# Patient Record
Sex: Female | Born: 1975 | Race: White | Hispanic: Yes | Marital: Single | Smoking: Never smoker
Health system: Southern US, Community
[De-identification: ages and names within clinical notes are randomized; demographics above are authoritative.]

---

## 2007-11-28 ENCOUNTER — Inpatient Hospital Stay (HOSPITAL_COMMUNITY): Admission: AD | Admit: 2007-11-28 | Discharge: 2007-11-28 | Payer: Self-pay | Admitting: Gynecology

## 2007-12-02 ENCOUNTER — Inpatient Hospital Stay (HOSPITAL_COMMUNITY): Admission: AD | Admit: 2007-12-02 | Discharge: 2007-12-02 | Payer: Self-pay | Admitting: Obstetrics & Gynecology

## 2007-12-12 ENCOUNTER — Ambulatory Visit: Payer: Self-pay | Admitting: Obstetrics & Gynecology

## 2007-12-12 ENCOUNTER — Other Ambulatory Visit: Admission: RE | Admit: 2007-12-12 | Discharge: 2007-12-12 | Payer: Self-pay | Admitting: Obstetrics and Gynecology

## 2007-12-26 ENCOUNTER — Ambulatory Visit: Payer: Self-pay | Admitting: Obstetrics and Gynecology

## 2009-12-24 ENCOUNTER — Ambulatory Visit (HOSPITAL_COMMUNITY): Admission: RE | Admit: 2009-12-24 | Discharge: 2009-12-24 | Payer: Self-pay | Admitting: Family Medicine

## 2010-01-10 ENCOUNTER — Ambulatory Visit (HOSPITAL_COMMUNITY): Admission: RE | Admit: 2010-01-10 | Discharge: 2010-01-10 | Payer: Self-pay | Admitting: Obstetrics & Gynecology

## 2010-02-14 ENCOUNTER — Ambulatory Visit: Payer: Self-pay | Admitting: Family Medicine

## 2010-02-21 ENCOUNTER — Ambulatory Visit: Payer: Self-pay | Admitting: Obstetrics & Gynecology

## 2010-02-21 ENCOUNTER — Encounter
Admission: RE | Admit: 2010-02-21 | Discharge: 2010-04-04 | Payer: Self-pay | Source: Home / Self Care | Attending: Family Medicine | Admitting: Family Medicine

## 2010-02-28 ENCOUNTER — Ambulatory Visit: Payer: Self-pay | Admitting: Obstetrics and Gynecology

## 2010-03-03 ENCOUNTER — Ambulatory Visit: Payer: Self-pay | Admitting: Obstetrics & Gynecology

## 2010-03-07 ENCOUNTER — Ambulatory Visit: Payer: Self-pay | Admitting: Obstetrics & Gynecology

## 2010-03-09 ENCOUNTER — Ambulatory Visit: Payer: Self-pay | Admitting: Obstetrics and Gynecology

## 2010-03-14 ENCOUNTER — Ambulatory Visit: Payer: Self-pay | Admitting: Obstetrics & Gynecology

## 2010-03-21 ENCOUNTER — Ambulatory Visit: Payer: Self-pay | Admitting: Obstetrics & Gynecology

## 2010-03-24 ENCOUNTER — Ambulatory Visit: Payer: Self-pay | Admitting: Obstetrics & Gynecology

## 2010-03-28 ENCOUNTER — Ambulatory Visit: Payer: Self-pay | Admitting: Obstetrics & Gynecology

## 2010-03-28 ENCOUNTER — Encounter: Payer: Self-pay | Admitting: Obstetrics & Gynecology

## 2010-03-28 LAB — CONVERTED CEMR LAB
Chlamydia, DNA Probe: NEGATIVE
GC Probe Amp, Genital: NEGATIVE

## 2010-03-29 ENCOUNTER — Encounter: Payer: Self-pay | Admitting: Obstetrics & Gynecology

## 2010-03-31 ENCOUNTER — Ambulatory Visit: Payer: Self-pay | Admitting: Obstetrics and Gynecology

## 2010-04-04 ENCOUNTER — Ambulatory Visit: Payer: Self-pay | Admitting: Obstetrics and Gynecology

## 2010-04-07 ENCOUNTER — Ambulatory Visit: Payer: Self-pay | Admitting: Obstetrics and Gynecology

## 2010-04-12 ENCOUNTER — Ambulatory Visit: Payer: Self-pay | Admitting: Obstetrics and Gynecology

## 2010-04-13 ENCOUNTER — Observation Stay (HOSPITAL_COMMUNITY)
Admission: RE | Admit: 2010-04-13 | Discharge: 2010-04-13 | Payer: Self-pay | Source: Home / Self Care | Attending: Obstetrics & Gynecology | Admitting: Obstetrics & Gynecology

## 2010-04-14 ENCOUNTER — Ambulatory Visit: Payer: Self-pay | Admitting: Obstetrics & Gynecology

## 2010-04-15 ENCOUNTER — Encounter: Payer: Self-pay | Admitting: Obstetrics & Gynecology

## 2010-04-18 ENCOUNTER — Ambulatory Visit
Admission: RE | Admit: 2010-04-18 | Discharge: 2010-04-18 | Payer: Self-pay | Source: Home / Self Care | Attending: Obstetrics & Gynecology | Admitting: Obstetrics & Gynecology

## 2010-04-19 ENCOUNTER — Inpatient Hospital Stay (HOSPITAL_COMMUNITY)
Admission: RE | Admit: 2010-04-19 | Discharge: 2010-04-23 | Payer: Self-pay | Source: Home / Self Care | Attending: Family Medicine | Admitting: Family Medicine

## 2010-04-20 LAB — GLUCOSE, CAPILLARY
Glucose-Capillary: 94 mg/dL (ref 70–99)
Glucose-Capillary: 91 mg/dL (ref 70–99)
Glucose-Capillary: 92 mg/dL (ref 70–99)
Glucose-Capillary: 90 mg/dL (ref 70–99)
Glucose-Capillary: 144 mg/dL — ABNORMAL HIGH (ref 70–99)
Glucose-Capillary: 161 mg/dL — ABNORMAL HIGH (ref 70–99)

## 2010-04-21 LAB — CBC
HCT: 32.4 % — ABNORMAL LOW (ref 36.0–46.0)
Hemoglobin: 11 g/dL — ABNORMAL LOW (ref 12.0–15.0)
MCH: 29.5 pg (ref 26.0–34.0)
MCHC: 34 g/dL (ref 30.0–36.0)
MCV: 86.9 fL (ref 78.0–100.0)
Platelets: 196 10*3/uL (ref 150–400)
RBC: 3.73 MIL/uL — ABNORMAL LOW (ref 3.87–5.11)
RDW: 13.1 % (ref 11.5–15.5)
WBC: 11.1 10*3/uL — ABNORMAL HIGH (ref 4.0–10.5)

## 2010-04-21 LAB — GLUCOSE, CAPILLARY
Glucose-Capillary: 104 mg/dL — ABNORMAL HIGH (ref 70–99)
Glucose-Capillary: 90 mg/dL (ref 70–99)
Glucose-Capillary: 159 mg/dL — ABNORMAL HIGH (ref 70–99)
Glucose-Capillary: 144 mg/dL — ABNORMAL HIGH (ref 70–99)

## 2010-04-22 LAB — GLUCOSE, CAPILLARY
Glucose-Capillary: 102 mg/dL — ABNORMAL HIGH (ref 70–99)
Glucose-Capillary: 122 mg/dL — ABNORMAL HIGH (ref 70–99)

## 2010-05-02 LAB — GLUCOSE, CAPILLARY
Glucose-Capillary: 115 mg/dL — ABNORMAL HIGH (ref 70–99)
Glucose-Capillary: 179 mg/dL — ABNORMAL HIGH (ref 70–99)
Glucose-Capillary: 110 mg/dL — ABNORMAL HIGH (ref 70–99)

## 2010-05-09 NOTE — Op Note (Addendum)
[name], [name]        ACCOUNT NO.:  [id number]  MEDICAL RECORD NO.:  [id number]         PATIENT TYPE:  WINP  LOCATION:  101                           FACILITY:  [facility]  PHYSICIAN:  Scheryl Darter, MD       DATE OF BIRTH:  01/18/1976  DATE OF PROCEDURE: DATE OF DISCHARGE:                              OPERATIVE REPORT   PROCEDURES:  A repeat low-transverse cesarean section and bilateral tubal ligation.  PREOPERATIVE DIAGNOSES: 1. Intrauterine pregnancy at 35 weeks' gestation with desire for     repeat C-section after the trial of labor. 2. Undesired fertility.  POSTOPERATIVE DIAGNOSES: 1. Intrauterine pregnancy, delivered. 2. Tubal sterilization.  SURGEON:  Scheryl Darter, MD  ASSISTANT:  Caren Griffins, CNM  ANESTHESIA:  Epidural.  ESTIMATED BLOOD LOSS:  900 mL.  FINDINGS:  Live born female infant, delivered at 2:47, 8 pounds, Apgars 9 and 9.  SPECIMEN:  None.  COMPLICATIONS:  None.  DRAINS:  Foley catheter.  COUNTS:  Correct.  OPERATIVE COURSE:  The patient gave written consent for repeat cesarean section and bilateral tubal ligation.  The patient was at 51 weeks' gestation undergoing induction of labor.  She had a previous cesarean section.  While being induced, she elected to have a cesarean section. She requested tubal sterilization.  Procedure and risks were explained. The patient identification was confirmed and she was brought to the OR. Adequate epidural anesthesia was induced.  She was placed in dorsal supine position.  She was in left lateral tilt.  Foley catheter was in place.  Abdomen was sterilely prepped and draped.  A #10 blade was used to make a Pfannenstiel incision down to the fascia.  Hemostasis was obtained with cautery.  The fascia was incised and the incision was extended transversely.  Fascia was separated from underlying tissue attachments with blunt and sharp dissection.  Bellies of the rectus muscles were separated.  Underlying  peritoneum was elevated and incised with Metzenbaum scissors and the incision was extended vertically. Bladder blade was inserted.  The peritoneum and the vesicouterine fold was incised transversely with Metzenbaum scissors and bladder flap was created.  A #10 blade was used to make an incision at the midline of the uterus.  Clear fluid was expressed.  Incision was extended transversely. Fetal head was elevated.  In order to assist in delivering the head, Kiwi was applied and head was delivered.  A loose nuchal cord was induced.  Mouth and nose were cleared with bulb suction.  Infant was delivered.  It was a live born female delivered at 2:47.  Cord was clamped, cut, and the infant was handed to nursery personnel in attendance.  Apgars were 9 and 9.  Weight was 8 pounds.  Placenta was removed and uterine cavity was explored.  Placenta was sent to L and D. The uterine incision was closed with a running locking suture with 0 Vicryl.  A second layer was used to imbricate with 0 Vicryl.  Good hemostasis was seen.  Both adnexa were inspected and were normal.  The right fallopian tube was elevated and a Filshie clip was placed at about the proximal one- third portion with good positioning  seen.  Likewise, the left tube was ligated with a Filshie clip.  The pelvis was irrigated.  The anterior peritoneum was closed with a running suture with 2-0 Vicryl.  Fascia was closed with a running suture of 0 Vicryl. Skin was irrigated.  Skin was closed with a running subcuticular suture of 4-0 Vicryl.  Sterile dressing was applied.  The patient tolerated the procedure well without complications.  She was brought in stable condition to recovery room.     Scheryl Darter, MD     JA/MEDQ  D:  04/20/2010  T:  04/20/2010  Job:  [id number]  Electronically Signed by Scheryl Darter MD on 05/09/2010 11:43:48 AM

## 2010-05-26 ENCOUNTER — Inpatient Hospital Stay (HOSPITAL_COMMUNITY)
Admission: AD | Admit: 2010-05-26 | Discharge: 2010-05-26 | Disposition: A | Discharge status: Home / Self Care | Payer: Self-pay | Source: Ambulatory Visit | Attending: Obstetrics & Gynecology | Admitting: Obstetrics & Gynecology

## 2010-05-26 DIAGNOSIS — O909 Complication of the puerperium, unspecified: Secondary | ICD-10-CM

## 2010-05-26 DIAGNOSIS — L259 Unspecified contact dermatitis, unspecified cause: Secondary | ICD-10-CM | POA: Insufficient documentation

## 2010-06-13 ENCOUNTER — Ambulatory Visit (INDEPENDENT_AMBULATORY_CARE_PROVIDER_SITE_OTHER): Payer: Self-pay | Admitting: Family Medicine

## 2010-06-13 DIAGNOSIS — F329 Major depressive disorder, single episode, unspecified: Secondary | ICD-10-CM

## 2010-06-13 DIAGNOSIS — F3289 Other specified depressive episodes: Secondary | ICD-10-CM

## 2010-06-17 DIAGNOSIS — Z0189 Encounter for other specified special examinations: Secondary | ICD-10-CM

## 2010-06-18 ENCOUNTER — Encounter: Payer: Self-pay | Admitting: Obstetrics & Gynecology

## 2010-06-18 LAB — CONVERTED CEMR LAB
Glucose, 2 hour: 104 mg/dL (ref 70–139)
Glucose, Fasting: 96 mg/dL (ref 70–99)

## 2010-06-27 LAB — GLUCOSE, CAPILLARY
Glucose-Capillary: 105 mg/dL — ABNORMAL HIGH (ref 70–99)
Glucose-Capillary: 118 mg/dL — ABNORMAL HIGH (ref 70–99)
Glucose-Capillary: 119 mg/dL — ABNORMAL HIGH (ref 70–99)
Glucose-Capillary: 139 mg/dL — ABNORMAL HIGH (ref 70–99)
Glucose-Capillary: 77 mg/dL (ref 70–99)
Glucose-Capillary: 83 mg/dL (ref 70–99)
Glucose-Capillary: 86 mg/dL (ref 70–99)
Glucose-Capillary: 91 mg/dL (ref 70–99)
Glucose-Capillary: 93 mg/dL (ref 70–99)
Glucose-Capillary: 93 mg/dL (ref 70–99)
Glucose-Capillary: 94 mg/dL (ref 70–99)
Glucose-Capillary: 94 mg/dL (ref 70–99)
Glucose-Capillary: 97 mg/dL (ref 70–99)
Glucose-Capillary: 98 mg/dL (ref 70–99)

## 2010-06-27 LAB — CBC
HCT: 39.7 % (ref 36.0–46.0)
Hemoglobin: 13.9 g/dL (ref 12.0–15.0)
MCH: 30 pg (ref 26.0–34.0)
MCHC: 35 g/dL (ref 30.0–36.0)
MCV: 85.7 fL (ref 78.0–100.0)
Platelets: 249 10*3/uL (ref 150–400)
RBC: 4.63 MIL/uL (ref 3.87–5.11)
RDW: 13.2 % (ref 11.5–15.5)
WBC: 9.4 10*3/uL (ref 4.0–10.5)

## 2010-06-27 LAB — POCT URINALYSIS DIPSTICK
Bilirubin Urine: NEGATIVE
Bilirubin Urine: NEGATIVE
Glucose, UA: NEGATIVE mg/dL
Glucose, UA: NEGATIVE mg/dL
Hgb urine dipstick: NEGATIVE
Ketones, ur: NEGATIVE mg/dL
Ketones, ur: NEGATIVE mg/dL
Nitrite: NEGATIVE
Nitrite: NEGATIVE
Protein, ur: 30 mg/dL — AB
Protein, ur: NEGATIVE mg/dL
Specific Gravity, Urine: 1.02 (ref 1.005–1.030)
Specific Gravity, Urine: 1.02 (ref 1.005–1.030)
Urobilinogen, UA: 0.2 mg/dL (ref 0.0–1.0)
Urobilinogen, UA: 1 mg/dL (ref 0.0–1.0)
pH: 6 (ref 5.0–8.0)
pH: 7 (ref 5.0–8.0)

## 2010-06-27 LAB — ABO/RH: ABO/RH(D): O POS

## 2010-06-27 LAB — RPR: RPR Ser Ql: NONREACTIVE

## 2010-06-28 LAB — POCT URINALYSIS DIPSTICK
Bilirubin Urine: NEGATIVE
Bilirubin Urine: NEGATIVE
Bilirubin Urine: NEGATIVE
Bilirubin Urine: NEGATIVE
Bilirubin Urine: NEGATIVE
Bilirubin Urine: NEGATIVE
Bilirubin Urine: NEGATIVE
Glucose, UA: NEGATIVE mg/dL
Glucose, UA: NEGATIVE mg/dL
Glucose, UA: NEGATIVE mg/dL
Glucose, UA: NEGATIVE mg/dL
Glucose, UA: NEGATIVE mg/dL
Glucose, UA: NEGATIVE mg/dL
Glucose, UA: NEGATIVE mg/dL
Hgb urine dipstick: NEGATIVE
Hgb urine dipstick: NEGATIVE
Hgb urine dipstick: NEGATIVE
Hgb urine dipstick: NEGATIVE
Hgb urine dipstick: NEGATIVE
Hgb urine dipstick: NEGATIVE
Hgb urine dipstick: NEGATIVE
Ketones, ur: 15 mg/dL — AB
Ketones, ur: NEGATIVE mg/dL
Ketones, ur: NEGATIVE mg/dL
Ketones, ur: NEGATIVE mg/dL
Ketones, ur: NEGATIVE mg/dL
Ketones, ur: NEGATIVE mg/dL
Ketones, ur: NEGATIVE mg/dL
Nitrite: NEGATIVE
Nitrite: NEGATIVE
Nitrite: NEGATIVE
Nitrite: NEGATIVE
Nitrite: NEGATIVE
Nitrite: NEGATIVE
Nitrite: NEGATIVE
Protein, ur: NEGATIVE mg/dL
Protein, ur: NEGATIVE mg/dL
Protein, ur: NEGATIVE mg/dL
Protein, ur: NEGATIVE mg/dL
Protein, ur: NEGATIVE mg/dL
Protein, ur: NEGATIVE mg/dL
Protein, ur: NEGATIVE mg/dL
Specific Gravity, Urine: 1.01 (ref 1.005–1.030)
Specific Gravity, Urine: 1.015 (ref 1.005–1.030)
Specific Gravity, Urine: 1.015 (ref 1.005–1.030)
Specific Gravity, Urine: 1.015 (ref 1.005–1.030)
Specific Gravity, Urine: 1.02 (ref 1.005–1.030)
Specific Gravity, Urine: 1.02 (ref 1.005–1.030)
Specific Gravity, Urine: 1.025 (ref 1.005–1.030)
Urobilinogen, UA: 0.2 mg/dL (ref 0.0–1.0)
Urobilinogen, UA: 0.2 mg/dL (ref 0.0–1.0)
Urobilinogen, UA: 0.2 mg/dL (ref 0.0–1.0)
Urobilinogen, UA: 0.2 mg/dL (ref 0.0–1.0)
Urobilinogen, UA: 0.2 mg/dL (ref 0.0–1.0)
Urobilinogen, UA: 0.2 mg/dL (ref 0.0–1.0)
Urobilinogen, UA: 0.2 mg/dL (ref 0.0–1.0)
pH: 5.5 (ref 5.0–8.0)
pH: 5.5 (ref 5.0–8.0)
pH: 6 (ref 5.0–8.0)
pH: 6 (ref 5.0–8.0)
pH: 6.5 (ref 5.0–8.0)
pH: 6.5 (ref 5.0–8.0)
pH: 7 (ref 5.0–8.0)

## 2010-06-28 LAB — GLUCOSE, CAPILLARY: Glucose-Capillary: 89 mg/dL (ref 70–99)

## 2010-06-29 LAB — POCT URINALYSIS DIPSTICK
Bilirubin Urine: NEGATIVE
Glucose, UA: NEGATIVE mg/dL
Hgb urine dipstick: NEGATIVE
Nitrite: NEGATIVE
Protein, ur: 30 mg/dL — AB
Specific Gravity, Urine: 1.025 (ref 1.005–1.030)
Urobilinogen, UA: 1 mg/dL (ref 0.0–1.0)
pH: 6.5 (ref 5.0–8.0)

## 2010-07-04 ENCOUNTER — Ambulatory Visit: Payer: Self-pay | Admitting: Occupational Therapy

## 2010-07-08 NOTE — Progress Notes (Signed)
NAME:  [name], [name] NO.:  [id number]  MEDICAL RECORD NO.:  [id number]           PATIENT TYPE:  A  LOCATION:  [facility]                   FACILITY:  [facility]  PHYSICIAN:  Maryelizabeth Kaufmann, MD  DATE OF BIRTH:  [birth year]  DATE OF SERVICE:  06/13/2010                                 CLINIC NOTE  CHIEF COMPLAINT:  Postpartum check.  HISTORY OF PRESENT ILLNESS:  This is a 35 year old gravida 2, para 2-0-0- 2, status post repeat C-section with bilateral tubal ligation after failed __________ labor.  She denies any acute complaints today.  She is breast feeding without any complications.  No redness, no swelling, or abnormal discharge.  She denies any problems with constipation.  Her last bowel movement was approximately 2-3 times a day in the past few days.  She is not currently sexually active.  She is status post bilateral tubal ligation for postpartum contraception.  She has had lochia that lasts about 3 weeks, but she states that her period is starting today on June 13, 2010.  She does have a history of postpartum depression and current depression __________ scale 13.  She denies any previous medication for this problem.  She endorses having the support at home.  She states that the baby is colicky and she seems to be mildly frustrated and she states that she desires to __________ the baby."  She denies otherwise any history of suicidal or homicidal ideation.  She denies any fever, chills, nausea, or vomiting.  PHYSICAL EXAMINATION:  VITAL SIGNS:  Blood pressure 112/64, pulse 62, temperature 98.2, weight 149.2. GENERAL:  No acute distress.  Alert and oriented x4. CHEST:  Clear to auscultation bilaterally.  No wheezing, rales, or rhonchi. CARDIOVASCULAR:  Regular rate and rhythm.  No murmurs, rubs, or gallops. BREAST:  Soft, nontender, nondistended.  No abnormal discharge.  No redness, no swelling.  No engorgement.  No evidence of mastitis or signs and  symptoms of infection. ABDOMEN:  Positive bowel sounds.  Soft, nontender, and nondistended. Incision was well healed. GU:  Sterile speculum exam, normal external genitalia.  Cervix appeared to be normal with some scant blood in vault.  Otherwise, vaginal mucosa appeared to be normal, pink, and moist.  Bimanual exam, soft, anteverted uterus.  Adnexa was bilateral, no tenderness to palpation.  No masses.  ASSESSMENT AND PLAN:  This is a 35 year old gravida 2, para 2-0-0-2, status post repeat C-section on April 20, 2010, of a term delivery status post bilateral tubal ligation.  1. Postpartum depression.  Spoke with patient extensively with the     interpreter and decided to start Zoloft 50 mg one tablet p.o.     daily, will have the patient return in 2 weeks for reevaluation of     her depression. 2. The patient does have a history of gestational diabetes.  The     patient was unable to get her 75 g of __________.  The patient was     advised to reschedule within the next couple of weeks, so we can     have those results at that     time, she comes back in a couple of weeks.  She discussed  that     further and otherwise, her Pap smear is up-to-date as of August     2011.          ______________________________ Maryelizabeth Kaufmann, MD    LC/MEDQ  D:  06/13/2010  T:  06/14/2010  Job:  [id number]

## 2010-07-13 ENCOUNTER — Ambulatory Visit: Payer: Self-pay | Admitting: Family Medicine

## 2010-08-30 NOTE — Group Therapy Note (Signed)
[name], [name]        ACCOUNT NO.:  [id number]   MEDICAL RECORD NO.:  [id number]          PATIENT TYPE:  WOC   LOCATION:  [facility]                   FACILITY:  [facility]   PHYSICIAN:  Johnella Moloney, MD        DATE OF BIRTH:  30-Sep-1975   DATE OF SERVICE:  12/12/2007                                  CLINIC NOTE   CHIEF COMPLAINT:  Dysfunctional uterine bleeding, MAU referral.   HISTORY OF PRESENT ILLNESS:  The patient is a 35 year old gravida 1,  para 1 with last menstrual period that started in mid July, who is here  for evaluation of dysfunctional uterine bleeding.  The patient was seen  on November 28, 2007 in MAU for a 22-day history of vaginal bleeding,  which began the spotting and increased since that time.  This had  started after her Pap smear that was done in July.  On evaluation in the  MAU she was noted to have stable vital signs and hemoglobin that was  done at that point was 9.1 with a hematocrit of 28.7 and ultrasound that  was done showed a distended endometrial canal with a hypoechoic sulcus  in the fundus.  Question clot versus submucosal fibroid versus polyp and  bilateral simple ovarian cysts were noted.  The patient was told to  follow up in the GYN clinic for further evaluation.  On encounter today  the patient denies having any further bleeding over the last few days.  She is nervous about this dysfunctional uterine bleeding and desires  further evaluation.   PAST MEDICAL HISTORY:  None.   PAST SURGICAL HISTORY:  Cesarean section.   PAST OBSTETRICAL/GYNECOLOGIC HISTORY:  The patient had menarche at age  32.  She had normal cycles the last 7 days.  She has heavy flow with her  menstrual period and moderate pain with her period.  She denies any  intermenstrual bleeding.  The patient is currently sexually active with  her husband.  She uses condoms for contraception.  She had one Cesarean  section.  Her last Pap smear was November 07, 2007, which was normal.   She  denies any history of abnormal Pap smears or any sexually transmitted  infections.   MEDICATIONS:  None.   ALLERGIES:  None.  The patient is not allergic to latex.   SOCIAL HISTORY:  The patient lives with her son.  She is currently  employed.  She denies any tobacco, smoking or alcohol history.  She  denies any past or current history of sexual abuse.   REVIEW OF SYSTEMS:  This is remarkable for fatigue and vaginal bleeding.   PHYSICAL EXAMINATION:  Temperature 97.2, pulse 68, blood pressure  105/53, respirations 20, weight 155.8 pounds.  GENERAL:  No apparent distress.  ABDOMEN:  Soft, nontender, nondistended.  PELVIC EXAMINATION:  Normal external female genitalia.  Pink, well  rugated vagina.  No bleeding noted.  Normal cervical contour.   ENDOMETRIAL BIOPSY:  That need for an endometrial biopsy was discussed  with the patient and risks of the biopsy were explained to the patient.  Written informed consent was obtained.  During the pelvic examination,  her cervix was swabbed with Betadine and the interior lip of the cervix  was grabbed with the tenaculum.  A 3 mm Pipelle was advanced into the  uterine cavity and went  in to a depth of 6 cm, and there was some  resistance met, which could not get the Pipelle past the 6 cm. At this  point, the suction was created and the Pipelle was slowly rotated to  obtain a small amount of tissue, which was sent to pathology.  The  patient was having a small amount of bleeding from the procedure and  also from the tenaculum site.  After this was removed, this was  ameliorated using pressure.  The patient was told to have pelvic rest  for the next 2-3 days, to use Motrin for cramping and will follow up in  2 weeks for discussion of results.   ASSESSMENT/PLAN:  [age]--year-old gravida 1 with dysfunctional  uterine bleeding now status post endometrial biopsy.  The patient will  follow up in 2 weeks for discussion of results.  If her  bleeding  continues, she might need a hysteroscopy for her diagnosis and removal  of submucosal lesion.  This will be discussed with her if this continues  to be a problem and pending benign results from the endometrial biopsy.  Of note, the patient is Spanish-speaking and a Spanish interpreter was  present during this entire encounter.           ______________________________  Johnella Moloney, MD     UD/MEDQ  D:  12/12/2007  T:  12/12/2007  Job:  [id number]

## 2011-01-13 LAB — CBC
HCT: 28.7 — ABNORMAL LOW
Hemoglobin: 9.1 — ABNORMAL LOW
MCHC: 31.8
MCV: 69.1 — ABNORMAL LOW
Platelets: 435 — ABNORMAL HIGH
RBC: 4.16
RDW: 17.8 — ABNORMAL HIGH
WBC: 7.6

## 2011-01-13 LAB — POCT PREGNANCY, URINE: Preg Test, Ur: NEGATIVE

## 2011-01-13 LAB — GC/CHLAMYDIA PROBE AMP, GENITAL
Chlamydia, DNA Probe: NEGATIVE
GC Probe Amp, Genital: NEGATIVE

## 2014-08-06 ENCOUNTER — Emergency Department (INDEPENDENT_AMBULATORY_CARE_PROVIDER_SITE_OTHER)
Admission: EM | Admit: 2014-08-06 | Discharge: 2014-08-06 | Disposition: A | Discharge status: Home / Self Care | Payer: Self-pay | Source: Home / Self Care

## 2014-08-06 ENCOUNTER — Encounter (HOSPITAL_COMMUNITY): Payer: Self-pay | Admitting: Emergency Medicine

## 2014-08-06 DIAGNOSIS — R059 Cough, unspecified: Secondary | ICD-10-CM

## 2014-08-06 DIAGNOSIS — J309 Allergic rhinitis, unspecified: Secondary | ICD-10-CM

## 2014-08-06 DIAGNOSIS — R05 Cough: Secondary | ICD-10-CM

## 2014-08-06 MED ORDER — GUAIFENESIN-CODEINE 100-10 MG/5ML PO SOLN: 5.0000 mL | Freq: Four times a day (QID) | ORAL | Status: DC | PRN

## 2014-08-06 MED ORDER — IPRATROPIUM BROMIDE 0.06 % NA SOLN: 2.0000 | Freq: Four times a day (QID) | NASAL | Status: DC

## 2014-08-06 MED ORDER — FLUTICASONE PROPIONATE 50 MCG/ACT NA SUSP: 2.0000 | Freq: Every day | NASAL | Status: DC

## 2014-08-06 NOTE — Discharge Instructions (Signed)
Your symptoms are likely due to severe seasonal allergies. Please use nasal saline multiple times per day to clean your nasal passage. Please use the nasal Atrovent to decrease your runny nose. Please use Flonase or Nasacort at night to help decrease the inflammation and drainage. Please use a daily allergy pill such as Claritin 10 mg or Zyrtec 10 mg. Please use the Robitussin-AC for cough at night to help you sleep. If your eyes are really bothering you you can use Zaditor drops  Sus sntomas son Veronica Garinprobablemente debido a las Theatre manageralergias estacionales graves . Por favor, use nasales salinos varias veces al da para limpiar el conducto nasal . Utilice el Atrovent nasal para disminuir la secrecin nasal . Por favor, use Flonase o Nasacort por la noche para ayudar a disminuir la inflamacin y drenaje. Por favor, use una pldora diaria de alergia tales como Claritin 10 mg o 10 mg Zyrtec . Utilice el Robitussin - CA para la tos por la noche para ayudar a dormir . Si sus ojos realmente estn molestando puede Administrator, sportsutilizar gotas Zaditor

## 2014-08-06 NOTE — ED Notes (Signed)
Patient c/o chest congestion, sneezing, rhinorrhea x 1 week. Patients husband is translating for her. Patient reports she has been taking Claritin with no relief. Patient is in NAD.

## 2014-08-06 NOTE — ED Provider Notes (Signed)
CSN: [id number]     Arrival date & time 08/06/14  1149 History   None    Chief Complaint  Patient presents with  . URI   (Consider location/radiation/quality/duration/timing/severity/associated sxs/prior Treatment) HPI  7 days ago devleoped itchy eyes and runny nose and sneezing. 2 days ago developed raspy voice. Coughing keeping her up at night. Symptoms are not getting worse but also not getting better. Symptoms are intermittent. Worse after being outdoors and in the evenings. claritin w/o benefit.  Denies fevers, nausea, vomiting, diarrhea, abdominal pain, headache, dizziness, LOC, chest pain, palpitations, shortness of breath. Husband acted as Engineer, technical salesinterpretor.    History reviewed. No pertinent past medical history. History reviewed. No pertinent past surgical history. Family History  Problem Relation Age of Onset  . Diabetes Mother    History  Substance Use Topics  . Smoking status: Never Smoker   . Smokeless tobacco: Not on file  . Alcohol Use: No   OB History    No data available     Review of Systems Per HPI with all other pertinent systems negative.   Allergies  Review of patient's allergies indicates no known allergies.  Home Medications   Prior to Admission medications   Medication Sig Start Date End Date Taking? Authorizing Provider  fluticasone (FLONASE) 50 MCG/ACT nasal spray Place 2 sprays into both nostrils at bedtime. 08/06/14   Ozella Rocksavid J Merrell, MD  guaiFENesin-codeine 100-10 MG/5ML syrup Take 5-10 mLs by mouth every 6 (six) hours as needed for cough. 08/06/14   Ozella Rocksavid J Merrell, MD  ipratropium (ATROVENT) 0.06 % nasal spray Place 2 sprays into both nostrils 4 (four) times daily. 08/06/14   Ozella Rocksavid J Merrell, MD   BP 137/73 mmHg  Pulse 78  Temp(Src) 97.6 F (36.4 C) (Oral)  Resp 12  SpO2 98% Physical Exam Physical Exam  Constitutional: oriented to person, place, and time. appears well-developed and well-nourished. No distress.  HENT:   Nasal congestion  and boggy nasal turbinates.   Head: Normocephalic and atraumatic.  Eyes: EOMI. PERRL.  Neck: Normal range of motion.  Cardiovascular: RRR, no m/r/g, 2+ distal pulses,  Pulmonary/Chest: Effort normal and breath sounds normal. No respiratory distress.  Abdominal: Soft. Bowel sounds are normal. NonTTP, no distension.  Musculoskeletal: Normal range of motion. Non ttp, no effusion.  Neurological: alert and oriented to person, place, and time.  Skin: Skin is warm. No rash noted. non diaphoretic.  Psychiatric: normal mood and affect. behavior is normal. Judgment and thought content normal.   ED Course  Procedures (including critical care time) Labs Review Labs Reviewed - No data to display  Imaging Review No results found.   MDM   1. Allergic rhinitis, unspecified allergic rhinitis type   2. Cough    Nasal saline, flonase, nasal atrovent, claritin or zyrtec, robitussin AC.     Ozella Rocksavid J Merrell, MD 08/06/14 1332

## 2014-08-28 ENCOUNTER — Ambulatory Visit (HOSPITAL_COMMUNITY): Payer: Self-pay | Attending: Emergency Medicine

## 2014-08-28 ENCOUNTER — Emergency Department (INDEPENDENT_AMBULATORY_CARE_PROVIDER_SITE_OTHER)
Admission: EM | Admit: 2014-08-28 | Discharge: 2014-08-28 | Disposition: A | Discharge status: Home / Self Care | Payer: Self-pay | Source: Home / Self Care | Attending: Family Medicine | Admitting: Family Medicine

## 2014-08-28 ENCOUNTER — Encounter (HOSPITAL_COMMUNITY): Payer: Self-pay | Admitting: Emergency Medicine

## 2014-08-28 DIAGNOSIS — M6580 Other synovitis and tenosynovitis, unspecified site: Secondary | ICD-10-CM

## 2014-08-28 DIAGNOSIS — M25561 Pain in right knee: Secondary | ICD-10-CM | POA: Insufficient documentation

## 2014-08-28 DIAGNOSIS — M76891 Other specified enthesopathies of right lower limb, excluding foot: Secondary | ICD-10-CM

## 2014-08-28 DIAGNOSIS — R609 Edema, unspecified: Secondary | ICD-10-CM | POA: Insufficient documentation

## 2014-08-28 MED ORDER — PREDNISONE 20 MG PO TABS: ORAL_TABLET | ORAL | Status: DC

## 2014-08-28 NOTE — Discharge Instructions (Signed)
Lesiones por distensiones repetidas  (Repetitive Strain Injuries) Las lesiones por distensiones repetidas son el resultado del uso excesivo o mal uso de los tejidos [employer], tendones y nervios. Los tendones son estructuras similares a cordones que [employer] a los [place]. Estas lesiones pueden producirse en casi cualquier parte del cuerpo. Sin embargo, son ms frecuentes en los brazos (pulgares, [place], codos, hombros) y en las piernas (tobillos, rodillas). Los problemas mdicos causados por esfuerzos repetitivos son el sndrome del tnel carpiano, el codo de [place] o de [place], la bursitis y la tendinitis. Si se trata a tiempo y [employer] repetida se reduce o se elimina, la gravedad y la duracin de sus problemas por lo general disminuye. Este problema tambin se llama trastorno por trauma acumulativo.  CAUSAS  En muchos casos el problema se produce por la repeticin de la misma actividad en el trabajo durante semanas o meses sin descanso suficiente, como tipiar por un tiempo prolongado. Tambin puede ocurrir cuando se Therapist, occupational un hobby o un deporte repetidamente, sin descanso suficiente. Tambin puede ocurrir debido a la tensin o estrs constante sobre una parte del cuerpo en una persona que tiene uno o ms factores de riesgo de sufrir lesiones por movimientos repetitivos.  FACTORES DE RIESGO  Factores de riesgo en el lugar de trabajo  Frecuente uso de la computadora, especialmente si su escritorio no se ajusta a su cuerpo.  No descansar con frecuencia.  Trabajar en un ambiente con mucha presin.  Trabajar a un ritmo rpido.  Repetir el mismo movimiento, como tipiar con frecuencia.  Trabajar en una posicin incmoda o mantener la misma posicin durante [place].  Hacer movimientos forzados Museum/gallery exhibitions officer, tirar o empujar.  La vibracin causada por el uso de herramientas elctricas.  Trabajar en temperaturas fras.  El estrs laboral. Factores de riesgo  personales  Mala postura.  Tener las articulaciones flojas.  No hacer ejercicio regularmente.  Tener sobrepeso.  Artritis, diabetes, problemas de tiroides u otras enfermedades de larga duracin (crnicas).  Dficit de vitaminas.  Mantener las uas largas.  Un estilo de vida poco saludable, estresante o inactivo.  No dormir bien. SNTOMAS  Los sntomas suelen comenzar en el trabajo pero se notan ms despus de que la tensin repetida ha terminado. Por ejemplo, usted puede sentir fatiga o dolor en la [employer] se escribe en el [place], y por la noche, usted puede desarrollar entumecimiento y hormigueo en los dedos. Los sntomas ms comunes son:   Dolor que arde, punza o dolor sordo, especialmente en los dedos, las palmas de las manos, las [place], antebrazos u hombros.  [place].  Hinchazn.  Hormigueo, adormecimiento o prdida de la sensibilidad.  Dolor al Solicitor actividades, como girar la perilla de una puerta o levantar el brazo por encima de su cabeza.  Debilidad, pesadez o prdida de la coordinacin en la mano.  Espasmos o rigidez muscular. En algunos casos, los sntomas pueden llegar a ser tan intensos que es difcil realizar las tareas diarias. Los sntomas que no mejoran con el reposo pueden indicar un trastorno ms grave.  DIAGNSTICO  El mdico puede determinar el tipo de lesin basndose en la evaluacin mdica y en la descripcin de sus actividades.  TRATAMIENTO  El tratamiento depende de la gravedad y el tipo de lesin que sufre. El Firefighter reposo para la parte del cuerpo [place], medicamentos y fisioterapia o terapia ocupacional para reducir Chief Technology Officer, la hinchazn y Chief Technology Officer. Analice con su mdico  las actividades repetitivas que realiza. Su mdico puede ayudarle a decidir si necesita modificar sus actividades. La lesin puede tardar meses o aos en curar, sobre todo si la parte afectada del cuerpo no reposa lo suficiente. En algunos  casos, como en el sndrome del tnel carpiano grave, podrn recomendarle [place]una ciruga.  PREVENCIN   Boyd Kerbsonverse con su supervisor para asegurarse de que tiene el equipo adecuado en su lugar de Pacetrabajo.  Mantenga una buena postura en su escritorio o Environmental consultantlugar de trabajo:  Colocando los pies planos sobre el piso.  Las rodillas deben quedar Beazer Homesdirectamente sobre los pies, dobladas en ngulo recto.  La cintura debe estar bien apoyada en la silla o coloque un almohadn en la curva de la cintura.  Los hombros y los brazos deben estar relajados y a los lados.  El cuello relajado y sin inclinar hacia adelante o hacia atrs.  El escritorio y Immunologistel lugar en que trabaja con la computadora deben estar bien ajustados a su cuerpo.  Ajuste la silla de modo que no presione excesivamente la parte posterior de los muslos.  El teclado debe descansar sobre sus muslos. Debe ser capaz de llegar a las teclas con los codos a los lados, doblados en un ngulo recto. Los antebrazos deben estar descansando sobre apoyabrazos, y quedar paralelos al suelo.  Tener un fcil acceso al mouse.  El monitor debe quedar directamente frente a usted, con los ojos alineadas con la parte superior de la pantalla. La pantalla debe estar entre 15 y 25 centmetros de los ojos.  Al escribir, BB&T Corporationmantenga las muecas rectas, en una posicin neutral. Mueva el brazo entero cuando mueva el puntero del mouse o al tipiar teclas difciles de Baristaalcanzar.  Slo utilice la computadora cuando la necesite para Printmakertrabajar. No la use durante los perodos de descanso.  Tome descansos con frecuencia al Heritage managerrealizar cualquier actividad repetida. [place] con otras tareas que requieran el uso de diferentes msculos, o descanse por lo menos una vez cada hora.  Cambie de posicin con regularidad. Si pasa mucho tiempo sentado, levntese, camine y estrese.  No sostener los lpices o los bolgrafos con fuerza al Programmer, applicationsescribir.  Haga ejercicios regularmente.  Mantenga un peso  saludable.  Consuma una dieta con gran cantidad de vegetales, granos enteros y frutas.  Duerma lo suficiente. INSTRUCCIONES PARA EL CUIDADO EN EL HOGAR   Si su mdico le recet medicamentos para ayudar a reducir la hinchazn, tmelos como le indicaron.  Slo tome medicamentos de venta libre o recetados para Primary school teachercalmar el dolor, las molestias o bajar la fiebre segn las indicaciones de su mdico.  [place]uzca, y si es necesario suspenda, las actividades que causan los problemas hasta que no tenga ningn otro sntoma. Si sus sntomas estn relacionados con Kathie Dikeel trabajo, es posible que necesite hablar con su supervisor acerca de cambiar sus [place]actividades.  Cuando aparezcan los sntomas, aplique hielo o una compresa fra en la zona dolorida.  Ponga el hielo en una bolsa plstica.  Colquese una toalla entre la piel y la bolsa de hielo.  Deje el hielo en el lugar durante 15 a 20 minutos.  Si le Patent attorneyaconsejaron usar un cabestrillo para evitar que la [place]ismueca se doble, selo como le indicaron. Es importante que use el cabestrillo durante la noche. selo todo el tiempo que el mdico se lo recomiende. SOLICITE ATENCIN MDICA SI:   Desarrolla nuevos sntomas.  El dolor no mejora con los medicamentos. ASEGRESE DE QUE:   Comprende estas instrucciones.  Controlar su enfermedad.  Solicitar ayuda de inmediato si no mejora o si empeora. Document Released: 07/20/2008 Document Revised: 10/03/2011 [facility] HospitalExitCare Patient Information 2015 [place]anoExitCare, MarylandLLC. This information is not intended to replace advice given to you by your health care provider. Make sure you discuss any questions you have with your health care provider.  Tendinitis (Tendinitis) La tendinitis es la hinchazn e inflamacin de los tendones. Los tendones son tejidos similares a una banda que conectan el msculo al [place] Prospecthueso. La tendinitis suele producirse en:   Los hombros (manguito rotador).  Los talones (tendn de [place]).  Los codos (tendn del  trceps). CAUSAS La tendinitis suele originarse en el uso excesivo del tendn, los msculos y la articulacin involucrados. Cuando el tejido que rodea al tendn (la sinovia) se inflama, se denomina tenosinovitis. En general, la tendinitis se presenta en las personas que hacen trabajos que requieren movimientos repetitivos. SNTOMAS  Dolor.  Sensibilidad.  Inflamacin leve. DIAGNSTICO La tendinitis normalmente se diagnostica con un examen fsico. El mdico tambin podr solicitar radiografas u otros estudios de diagnstico por imgenes. TRATAMIENTO El mdico podr recomendar determinados medicamentos o ejercicios para Scientist, research (medical)el tratamiento. INSTRUCCIONES PARA EL CUIDADO EN EL HOGAR   Use un cabestrillo o una frula durante todo el tiempo indicado por el mdico hasta que Secretary/administratordisminuya el dolor.  Aplique hielo sobre la zona lesionada.  Ponga el hielo en una bolsa plstica.  Colquese una toalla entre la piel y la bolsa de hielo.  Aplique el hielo durante 15 a 20minutos, 3 a 4veces por da, o como se lo haya indicado el mdico.  Evite utilizar la extremidad mientras sienta dolor en el tendn. Haga ejercicios suaves con amplitud de movimientos solamente como se los haya indicado el mdico. Suspenda los ejercicios si el dolor o las molestias [place]asota Springsaumentan, a menos que el mdico le indique otra cosa.  Utilice los medicamentos de venta libre o recetados para Primary school teachercalmar el dolor, Environmental health practitionerel malestar o la fiebre, segn se lo indique el mdico. SOLICITE ATENCIN MDICA SI:   El dolor y la hinchazn aumentan.  Presenta sntomas nuevos o desconocidos, especialmente mayor adormecimiento en las manos. ASEGRESE DE QUE:   Comprende estas instrucciones.  Controlar su afeccin.  Recibir ayuda de inmediato si no mejora o si empeora. Document Released: 01/11/2005 Document Revised: 08/18/2013 [facility] HospitalExitCare Patient Information 2015 MidwestExitCare, MarylandLLC. This information is not intended to replace advice given to you by your  health care provider. Make sure you discuss any questions you have with your health care provider.

## 2014-08-28 NOTE — ED Notes (Signed)
Patient transported to X-ray 

## 2014-08-28 NOTE — ED Notes (Signed)
C/o right knee pain and swelling onset 5/11 Denies inj/trauma Had similar problem a year ago; treated w/coritisone inj that helped Alert, no signs of acute distress.

## 2014-08-28 NOTE — ED Provider Notes (Signed)
CSN: [id number]     Arrival date & time 08/28/14  1350 History   First MD Initiated Contact with Patient 08/28/14 1439     Chief Complaint  Patient presents with  . Knee Pain   (Consider location/radiation/quality/duration/timing/severity/associated sxs/prior Treatment) HPI Comments: 39 year old Spanish female does not speak [place]English. Arlyn DunningMaggie Mena was interpreter. Patient is complaining of right knee pain that developed approximately 2 days ago. She just woke up with it. She denies any known injury, trauma or fall. She states is been getting worse over the past couple days. Her job requires standing for at least 8 hours every day. She is moderately obese. She states she had this problem approximate 1 year ago and received an injection that helped. The pain is located posterior aspect medial aspect and right anterolateral aspect of the knee. It is painful with attempts to extend as well as flex the knee. She has no PCP.   History reviewed. No pertinent past medical history. History reviewed. No pertinent past surgical history. Family History  Problem Relation Age of Onset  . Diabetes Mother    History  Substance Use Topics  . Smoking status: Never Smoker   . Smokeless tobacco: Not on file  . Alcohol Use: No   OB History    No data available     Review of Systems  Constitutional: Positive for activity change. Negative for fever and fatigue.  Respiratory: Negative for shortness of breath.   Cardiovascular: Negative for chest pain.  Genitourinary: Negative.   Musculoskeletal: Positive for joint swelling. Negative for back pain and neck pain.       As per history of present illness  Skin: Negative.   Neurological: Negative.   Psychiatric/Behavioral: Negative.     Allergies  Review of patient's allergies indicates no known allergies.  Home Medications   Prior to Admission medications   Medication Sig Start Date End Date Taking? Authorizing Provider  fluticasone (FLONASE) 50  MCG/ACT nasal spray Place 2 sprays into both nostrils at bedtime. 08/06/14   Ozella Rocksavid J Merrell, MD  guaiFENesin-codeine 100-10 MG/5ML syrup Take 5-10 mLs by mouth every 6 (six) hours as needed for cough. 08/06/14   Ozella Rocksavid J Merrell, MD  ipratropium (ATROVENT) 0.06 % nasal spray Place 2 sprays into both nostrils 4 (four) times daily. 08/06/14   Ozella Rocksavid J Merrell, MD   BP 135/78 mmHg  Pulse 66  Temp(Src) 98.2 F (36.8 C) (Oral)  Resp 16  SpO2 100% Physical Exam  Constitutional: She appears well-developed and well-nourished. No distress.  Neck: Normal range of motion. Neck supple.  Pulmonary/Chest: Effort normal. No respiratory distress.  Musculoskeletal: She exhibits edema and tenderness.  Right knee with generalized swelling. No erythema or increased warmth. There is tenderness to the medial and lateral joint spaces. Tenderness along the lateral joint line as well as over the anterior proximal tibia and posterior aspect of the knee. Extension to 170 with pain. Flexion to 100 with much pain throughout the knee.  Neurological: She is alert. She exhibits normal muscle tone.  Skin: Skin is warm and dry.  Psychiatric: She has a normal mood and affect.  Nursing note and vitals reviewed.   ED Course  Procedures (including critical care time) Labs Review Labs Reviewed - No data to display  Imaging Review Dg Knee Complete 4 Views Right  08/28/2014   CLINICAL DATA:  Right knee pain with edema for 1 day. No known injury.  EXAM: RIGHT KNEE - COMPLETE 4+ VIEW  COMPARISON:  None.  FINDINGS: No fracture. No bone lesion. Knee joint is normally spaced and aligned. No significant arthropathic change. No joint effusion is evident  Prominent superficial veins are noted in the proximal calf soft tissues. Mild anterior subcutaneous soft tissue edema.  IMPRESSION: No fracture or significant joint abnormality.  No joint effusion.   Electronically Signed   By: Amie Portlandavid  Ormond M.D.   On: 08/28/2014 17:01     MDM    1. Tendinitis of right knee    Ice Knee sleeve for next several days while standing/walking Prednisone as dir F/U with ortho       Hayden Rasmussenavid Avon Mergenthaler, NP 08/28/14 1729

## 2014-09-25 ENCOUNTER — Emergency Department (INDEPENDENT_AMBULATORY_CARE_PROVIDER_SITE_OTHER)
Admission: EM | Admit: 2014-09-25 | Discharge: 2014-09-25 | Disposition: A | Discharge status: Home / Self Care | Payer: Self-pay | Source: Home / Self Care | Attending: Emergency Medicine | Admitting: Emergency Medicine

## 2014-09-25 ENCOUNTER — Encounter (HOSPITAL_COMMUNITY): Payer: Self-pay | Admitting: Emergency Medicine

## 2014-09-25 DIAGNOSIS — M25561 Pain in right knee: Secondary | ICD-10-CM

## 2014-09-25 MED ORDER — HYDROCODONE-ACETAMINOPHEN 5-325 MG PO TABS: 1.0000 | ORAL_TABLET | Freq: Four times a day (QID) | ORAL | Status: DC | PRN

## 2014-09-25 MED ORDER — METHYLPREDNISOLONE ACETATE 40 MG/ML IJ SUSP
INTRAMUSCULAR | Status: AC
  Filled 2014-09-25: qty 1

## 2014-09-25 MED ORDER — NAPROXEN 500 MG PO TABS: 500.0000 mg | ORAL_TABLET | Freq: Two times a day (BID) | ORAL | Status: DC

## 2014-09-25 MED ORDER — HYDROCODONE-ACETAMINOPHEN 5-325 MG PO TABS
ORAL_TABLET | ORAL | Status: AC
  Filled 2014-09-25: qty 1

## 2014-09-25 MED ORDER — HYDROCODONE-ACETAMINOPHEN 5-325 MG PO TABS
1.0000 | ORAL_TABLET | Freq: Once | ORAL | Status: AC
  Administered 2014-09-25: 1 via ORAL

## 2014-09-25 NOTE — ED Provider Notes (Signed)
CSN: [id number]     Arrival date & time 09/25/14  1701 History   None    Chief Complaint  Patient presents with  . Knee Pain   (Consider location/radiation/quality/duration/timing/severity/associated sxs/prior Treatment) HPI  She is a 39 year old woman here for evaluation of right knee pain.  Her husband is present and ask as interpreter. She states her right knee became painful and swollen this afternoon after working. She works at [employer] in [employer], standing on her feet all day. She has had similar pain in the past, but not this bad. She denies any injury or trauma to the knee. Pain is worse with flexion or extension of the knee. Range of motion is limited due to pain. She was last treated for this 1 month ago with a prednisone course, which did help.  History reviewed. No pertinent past medical history. History reviewed. No pertinent past surgical history. Family History  Problem Relation Age of Onset  . Diabetes Mother    History  Substance Use Topics  . Smoking status: Never Smoker   . Smokeless tobacco: Not on file  . Alcohol Use: No   OB History    No data available     Review of Systems As in history of present illness Allergies  Review of patient's allergies indicates no known allergies.  Home Medications   Prior to Admission medications   Medication Sig Start Date End Date Taking? Authorizing Provider  fluticasone (FLONASE) 50 MCG/ACT nasal spray Place 2 sprays into both nostrils at bedtime. 08/06/14   Ozella Rocks, MD  guaiFENesin-codeine 100-10 MG/5ML syrup Take 5-10 mLs by mouth every 6 (six) hours as needed for cough. 08/06/14   Ozella Rocks, MD  HYDROcodone-acetaminophen (NORCO) 5-325 MG per tablet Take 1 tablet by mouth every 6 (six) hours as needed for moderate pain. 09/25/14   Charm Rings, MD  ipratropium (ATROVENT) 0.06 % nasal spray Place 2 sprays into both nostrils 4 (four) times daily. 08/06/14   Ozella Rocks, MD  naproxen (NAPROSYN) 500 MG  tablet Take 1 tablet (500 mg total) by mouth 2 (two) times daily. For 2 weeks, then as needed 09/25/14   Charm Rings, MD   BP 107/67 mmHg  Pulse 68  Temp(Src) 100.2 F (37.9 C) (Oral)  Resp 16  SpO2 99% Physical Exam  Constitutional: She is oriented to person, place, and time. She appears well-developed and well-nourished. She appears distressed ( uncomfortable with exam).  Cardiovascular: Normal rate.   Pulmonary/Chest: Effort normal.  Musculoskeletal:  Right knee: No erythema. She has some soft tissue swelling as well as a joint effusion. She is diffusely tender around the patella and medial and lateral joint lines. Range of motion is limited due to pain. Unable to fully extend the leg. Also not able to flex the knee much passed 120.  Neurological: She is alert and oriented to person, place, and time.    ED Course  Injection of joint Date/Time: 09/25/2014 6:28 PM Performed by: Charm Rings Authorized by: Charm Rings Consent: Verbal consent obtained. Risks and benefits: risks, benefits and alternatives were discussed Consent given by: patient Patient understanding: patient states understanding of the procedure being performed Patient identity confirmed: verbally with patient Time out: Immediately prior to procedure a "time out" was called to verify the correct patient, procedure, equipment, support staff and site/side marked as required. Comments: Skin was prepped with alcohol. Cold spray and 2% lidocaine was used for anesthesia. I attempted to drain  fluid from a superior lateral approach without success. I was able to access the joint space from a lateral approach. I removed approximately 5 mL of clear straw-colored fluid. The syringe was changed using hemostats. I injected 4 mL of 2% lidocaine with 40 mg of Depo-Medrol. Patient tolerated procedure well without immediate complication.   (including critical care time) Labs Review Labs Reviewed  SYNOVIAL CELL COUNT + DIFF, W/  CRYSTALS    Imaging Review No results found.   MDM   1. Right knee pain    Norco 5-325 milligrams by mouth given.  Steroid injection done today after draining 5 mL of fluid. Prescription for Naprosyn as well as Norco given. Recommended frequent icing for the next 2 days. Work note provided. Follow-up in 2 weeks if no improvement.    Charm Rings, MD 09/25/14 [phone]

## 2014-09-25 NOTE — ED Notes (Signed)
Patient c/o knee pain onset today. Patient reports she was seen and treated for this x 1 month ago but does not have any refills. Patients husband is translating for her. He states she cannot bear any weight. Patient is using a wheelchair.

## 2014-09-25 NOTE — Discharge Instructions (Signed)
You may have some early arthritis in the knee. We drained some fluid today and sent it for analysis. We did a steroid injection. This takes 2 days to reach full effect. Please apply ice to the knee frequently over the next 2 days. Take Naprosyn 1 pill twice a day for the next 2 weeks. After that you can use it as needed. Use the Norco every 4-6 hours as needed for severe pain. Follow-up if no improvement in 2 weeks.

## 2015-05-18 IMAGING — DX DG KNEE COMPLETE 4+V*R*
4 series · 4 of 4 positions shown · non-contrast
Comparison: None.

CLINICAL DATA: Right knee pain with edema for 1 day. No known
injury.

EXAM:
RIGHT KNEE - COMPLETE 4+ VIEW

[knee ap]
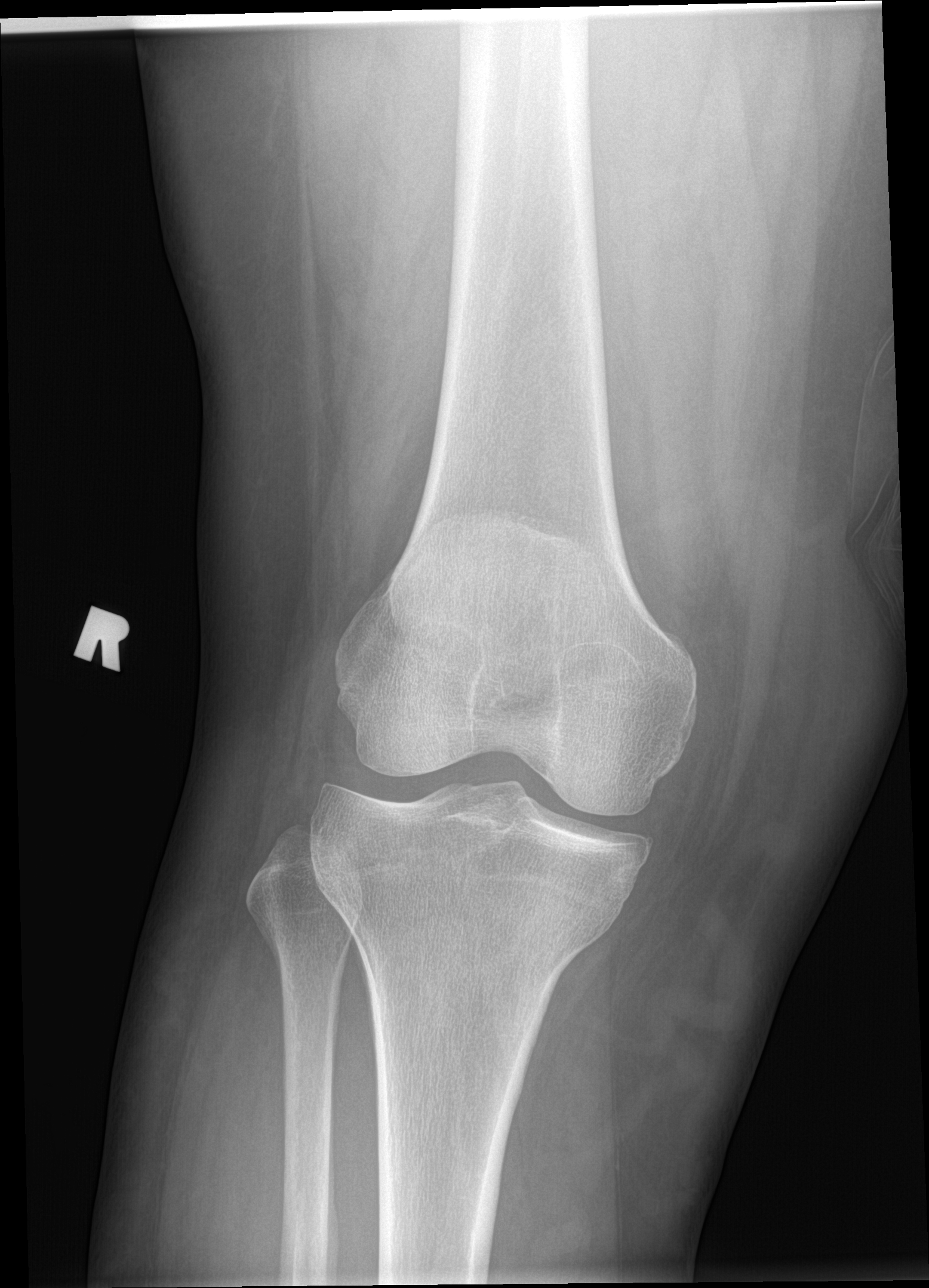

[knee obl]
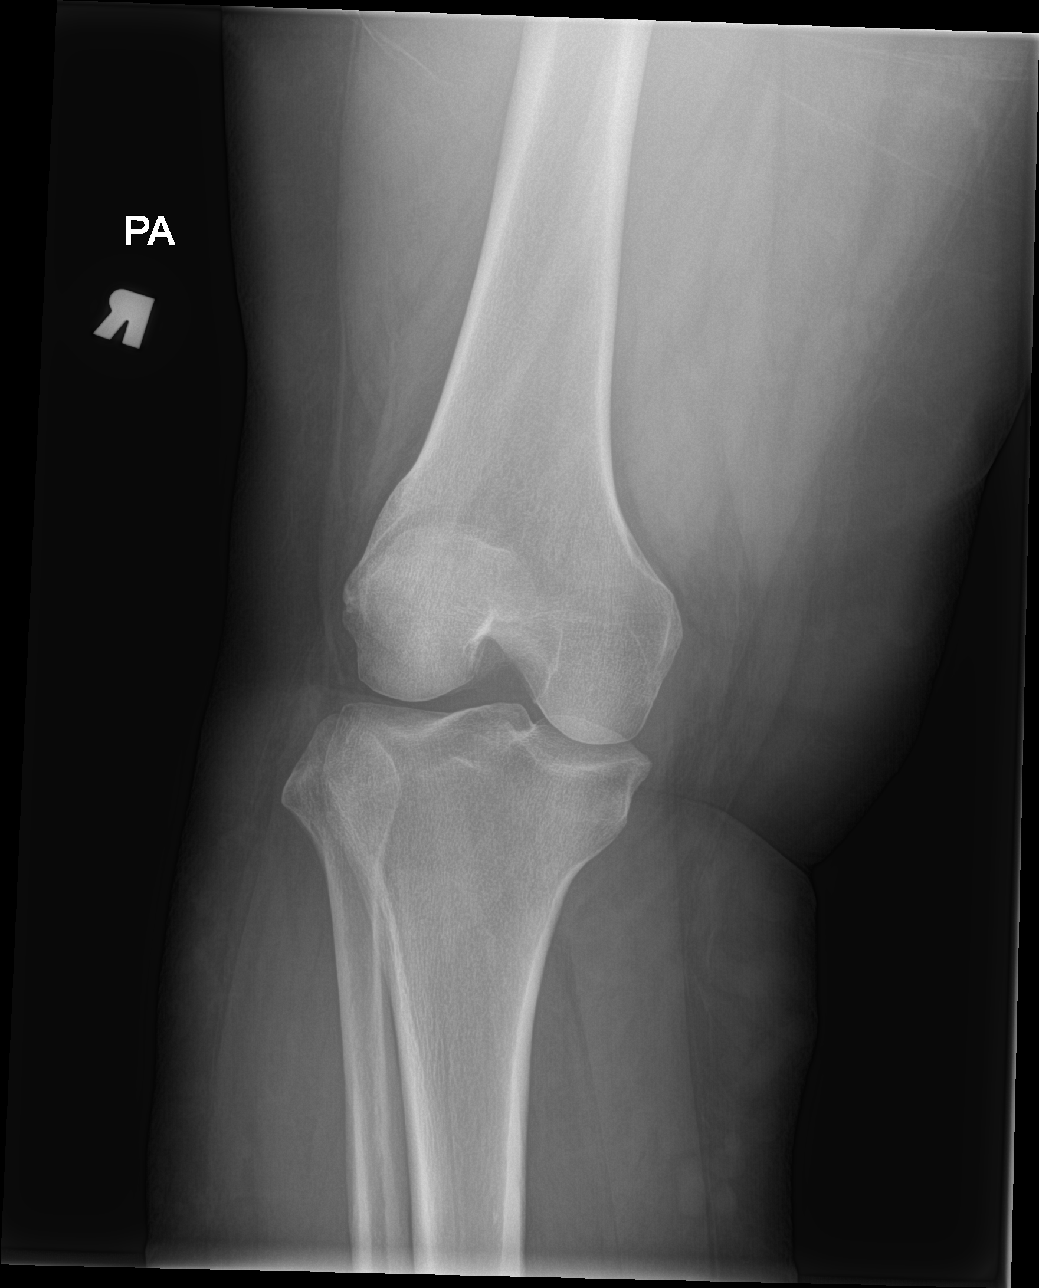

[knee lat]
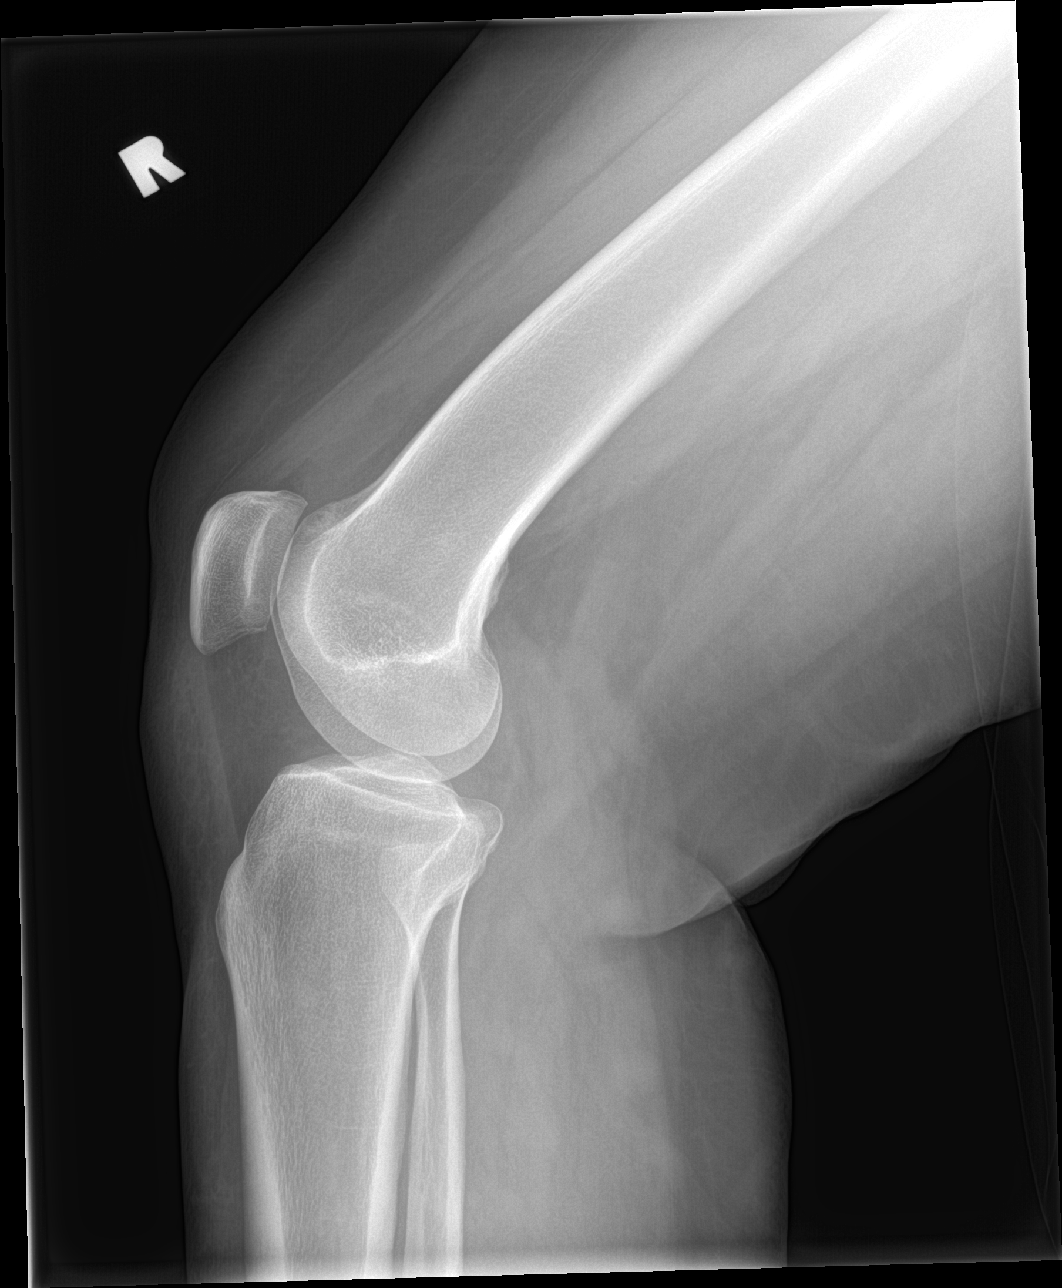

[knee sunrise]
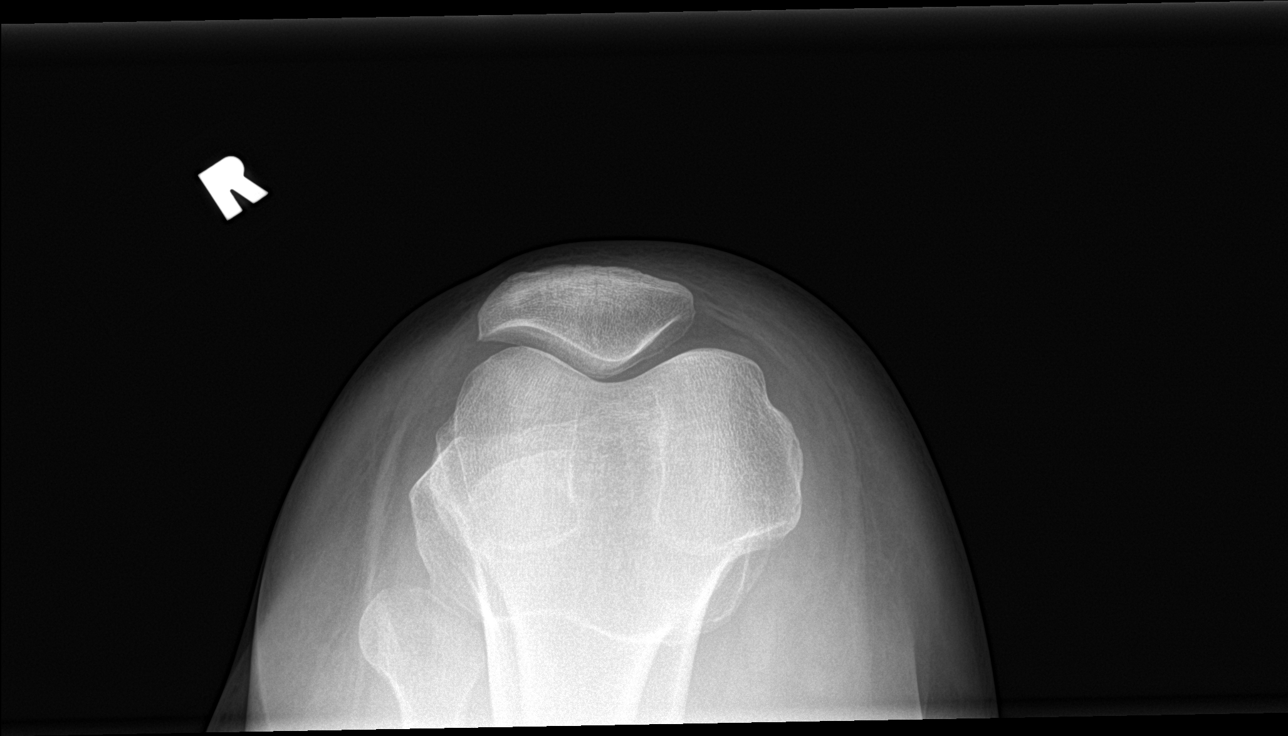

[4 of 4 positions shown; findings below may reference images not displayed]

FINDINGS: No fracture. No bone lesion. Knee joint is normally spaced and
aligned. No significant arthropathic change. No joint effusion is
evident

Prominent superficial veins are noted in the proximal calf soft
tissues. Mild anterior subcutaneous soft tissue edema.
IMPRESSION: No fracture or significant joint abnormality.  No joint effusion.

## 2019-08-25 ENCOUNTER — Ambulatory Visit
Admission: EM | Admit: 2019-08-25 | Discharge: 2019-08-25 | Disposition: A | Discharge status: Home / Self Care | Payer: Self-pay | Attending: Physician Assistant | Admitting: Physician Assistant

## 2019-08-25 DIAGNOSIS — J301 Allergic rhinitis due to pollen: Secondary | ICD-10-CM

## 2019-08-25 DIAGNOSIS — H1013 Acute atopic conjunctivitis, bilateral: Secondary | ICD-10-CM

## 2019-08-25 MED ORDER — FLUTICASONE PROPIONATE 50 MCG/ACT NA SUSP: 2.0000 | Freq: Every day | NASAL | 0 refills | Status: DC

## 2019-08-25 MED ORDER — CETIRIZINE HCL 10 MG PO TABS: 10.0000 mg | ORAL_TABLET | Freq: Every day | ORAL | 0 refills | Status: DC

## 2019-08-25 MED ORDER — PATADAY 0.7 % OP SOLN: 1.0000 [drp] | Freq: Every day | OPHTHALMIC | 0 refills | Status: DC

## 2019-08-25 NOTE — ED Provider Notes (Signed)
[facility]    CSN: [id number] Arrival date & time: 08/25/19  1045      History   Chief Complaint Chief Complaint  Patient presents with  . Allergies    HPI [name] is a 44 y.o. female.   44 year old female comes in for 1 month history of URI symptoms. Has had rhinorrhea, nasal congestion, watering/itching eyes, sneezing. Denies cough. Denies fever, chills, body aches. Denies abdominal pain, nausea, vomiting, diarrhea. Denies shortness of breath, loss of taste/smell. Has been trying otc antihistamine without relief. Vaccinated.     History reviewed. No pertinent past medical history.  There are no problems to display for this patient.   History reviewed. No pertinent surgical history.  OB History   No obstetric history on file.      Home Medications    Prior to Admission medications   Medication Sig Start Date End Date Taking? Authorizing Provider  cetirizine (ZYRTEC ALLERGY) 10 MG tablet Take 1 tablet (10 mg total) by mouth daily. 08/25/19   Cathie Hoops, Milania Haubner V, PA-C  fluticasone (FLONASE) 50 MCG/ACT nasal spray Place 2 sprays into both nostrils daily. 08/25/19   Cathie Hoops, Lorielle Boehning V, PA-C  Olopatadine HCl (PATADAY) 0.7 % SOLN Apply 1 drop to eye daily. 08/25/19   Belinda Fisher, PA-C    Family History Family History  Problem Relation Age of Onset  . Diabetes Mother     Social History Social History   Tobacco Use  . Smoking status: Never Smoker  . Smokeless tobacco: Never Used  Substance Use Topics  . Alcohol use: No  . Drug use: No     Allergies   Patient has no known allergies.   Review of Systems Review of Systems  Reason unable to perform ROS: See HPI as above.     Physical Exam Triage Vital Signs ED Triage Vitals  Enc Vitals Group     BP 08/25/19 1055 123/76     Pulse Rate 08/25/19 1055 (!) 57     Resp 08/25/19 1055 16     Temp 08/25/19 1055 98.1 F (36.7 C)     Temp Source 08/25/19 1055 Oral     SpO2 08/25/19 1055 98 %   Weight --      Height --      Head Circumference --      Peak Flow --      Pain Score 08/25/19 1056 0     Pain Loc --      Pain Edu? --      Excl. in GC? --    No data found.  Updated Vital Signs BP 123/76 (BP Location: Left Arm)   Pulse (!) 57   Temp 98.1 F (36.7 C) (Oral)   Resp 16   SpO2 98%   Physical Exam Constitutional:      General: She is not in acute distress.    Appearance: Normal appearance. She is well-developed. She is not ill-appearing, toxic-appearing or diaphoretic.  HENT:     Head: Normocephalic and atraumatic.     Right Ear: Tympanic membrane, ear canal and external ear normal. Tympanic membrane is not erythematous or bulging.     Left Ear: Tympanic membrane, ear canal and external ear normal. Tympanic membrane is not erythematous or bulging.     Nose:     Right Sinus: No maxillary sinus tenderness or frontal sinus tenderness.     Left Sinus: No maxillary sinus tenderness or frontal sinus tenderness.  Mouth/Throat:     Mouth: Mucous membranes are moist.     Pharynx: Oropharynx is clear. Uvula midline.  Eyes:     General: Lids are normal.     Extraocular Movements: Extraocular movements intact.     Conjunctiva/sclera:     Right eye: Right conjunctiva is injected.     Left eye: Left conjunctiva is injected.     Pupils: Pupils are equal, round, and reactive to light.  Cardiovascular:     Rate and Rhythm: Normal rate and regular rhythm.  Pulmonary:     Effort: Pulmonary effort is normal. No accessory muscle usage, prolonged expiration, respiratory distress or retractions.     Breath sounds: No decreased air movement or transmitted upper airway sounds. No decreased breath sounds.     Comments: LCTAB Musculoskeletal:     Cervical back: Normal range of motion and neck supple.  Skin:    General: Skin is warm and dry.  Neurological:     Mental Status: She is alert and oriented to person, place, and time.      UC Treatments / Results  Labs (all  labs ordered are listed, but only abnormal results are displayed) Labs Reviewed - No data to display  EKG   Radiology No results found.  Procedures Procedures (including critical care time)  Medications Ordered in UC Medications - No data to display  Initial Impression / Assessment and Plan / UC Course  I have reviewed the triage vital signs and the nursing notes.  Pertinent labs & imaging results that were available during my care of the patient were reviewed by me and considered in my medical decision making (see chart for details).    Will start zyrtec, flonase, pataday for current symptoms. Patient without current PCP, will make PCP appointment for follow up. However, if symptoms not improving while waiting for appointment, consider adding singulair and/or azelastine nasal spray for further symptomatic relief. Return precautions given.  Final Clinical Impressions(s) / UC Diagnoses   Final diagnoses:  Seasonal allergic rhinitis due to pollen  Allergic conjunctivitis of both eyes   ED Prescriptions    Medication Sig Dispense Auth. Provider   Olopatadine HCl (PATADAY) 0.7 % SOLN Apply 1 drop to eye daily. 2.5 mL Aishani Kalis V, PA-C   fluticasone (FLONASE) 50 MCG/ACT nasal spray Place 2 sprays into both nostrils daily. 1 g Maninder Deboer V, PA-C   cetirizine (ZYRTEC ALLERGY) 10 MG tablet Take 1 tablet (10 mg total) by mouth daily. 30 tablet Ok Edwards, PA-C     PDMP not reviewed this encounter.   Ok Edwards, PA-C 08/25/19 1118

## 2019-08-25 NOTE — ED Triage Notes (Signed)
Pt c/o allergies for over a month, taking OTC meds with no relief. Pt c/o itchy, watery, puffy eyes and itchy throat. States has had both covid vaccines.

## 2019-08-25 NOTE — Discharge Instructions (Signed)
Start zyrtec as directed. Flonase nasal spray and pataday eye drops as directed. Follow up with PCP if symptoms not improving for further evaluation and management needed.

## 2019-09-18 ENCOUNTER — Other Ambulatory Visit: Payer: Self-pay

## 2019-09-18 ENCOUNTER — Encounter: Payer: Self-pay | Admitting: Internal Medicine

## 2019-09-18 ENCOUNTER — Telehealth (INDEPENDENT_AMBULATORY_CARE_PROVIDER_SITE_OTHER): Payer: Self-pay | Admitting: Internal Medicine

## 2019-09-18 DIAGNOSIS — Z7689 Persons encountering health services in other specified circumstances: Secondary | ICD-10-CM

## 2019-09-18 DIAGNOSIS — J309 Allergic rhinitis, unspecified: Secondary | ICD-10-CM

## 2019-09-18 DIAGNOSIS — H1013 Acute atopic conjunctivitis, bilateral: Secondary | ICD-10-CM

## 2019-09-18 NOTE — Progress Notes (Signed)
Virtual Visit via Telephone Note  I connected with [name], on 09/18/2019 at 1:57 PM by telephone due to the COVID-19 pandemic and verified that I am speaking with the correct person using two identifiers.   Consent: I discussed the limitations, risks, security and privacy concerns of performing an evaluation and management service by telephone and the availability of in person appointments. I also discussed with the patient that there may be a patient responsible charge related to this service. The patient expressed understanding and agreed to proceed.   Location of Patient: Home   Location of Provider: Clinic    Persons participating in Telemedicine visit: Meelah Tallo [facility] Dr. Earlene Plater  Amber-interpreter      History of Present Illness: Patient has a visit to establish care. Reports no significant PMH. No past surgical history. Was previously receiving care at [facility]. Reports last visit for >2 years ago.   Was seen at urgent care 5/10 for allergy symptoms. Was prescribed Zyrtec, Flonase, and Pataday. Reports her symptoms are much better controlled. She has no concerns.    No past medical history on file. No Known Allergies  Current Outpatient Medications on File Prior to Visit  Medication Sig Dispense Refill  . cetirizine (ZYRTEC ALLERGY) 10 MG tablet Take 1 tablet (10 mg total) by mouth daily. 30 tablet 0  . fluticasone (FLONASE) 50 MCG/ACT nasal spray Place 2 sprays into both nostrils daily. 1 g 0  . Olopatadine HCl (PATADAY) 0.7 % SOLN Apply 1 drop to eye daily. 2.5 mL 0   No current facility-administered medications on file prior to visit.    Observations/Objective: NAD. Speaking clearly.  Work of breathing normal.  Alert and oriented. Mood appropriate.   Assessment and Plan: 1. Encounter to establish care Reviewed patient's PMH, social history, surgical history, and medications.  Is overdue for annual exam, screening blood work,  and health maintenance topics. Have asked patient to return for visit to address these items.   2. Allergic conjunctivitis of both eyes and rhinitis Improving and well controlled with current regimen of antihistamine, nasal corticosteroid, and eye drops for allergic conjunctivitis.    Follow Up Instructions: Annual exam    I discussed the assessment and treatment plan with the patient. The patient was provided an opportunity to ask questions and all were answered. The patient agreed with the plan and demonstrated an understanding of the instructions.   The patient was advised to call back or seek an in-person evaluation if the symptoms worsen or if the condition fails to improve as anticipated.    I provided 10 minutes total of non-face-to-face time during this encounter including median intraservice time, reviewing previous notes, investigations, ordering medications, medical decision making, coordinating care and patient verbalized understanding at the end of the visit.    Marcy Siren, D.O. Primary Care at [facility]  09/18/2019, 1:57 PM

## 2019-11-19 NOTE — Patient Instructions (Signed)
Mantenimiento de la salud en las mujeres Health Maintenance, Female Adoptar un estilo de vida saludable y recibir atencin preventiva son importantes para promover la salud y el bienestar. Consulte al mdico sobre:  El esquema adecuado para hacerse pruebas y exmenes peridicos.  Cosas que puede hacer por su cuenta para prevenir enfermedades y mantenerse sana. Qu debo saber sobre la dieta, el peso y el ejercicio? Consuma una dieta saludable   Consuma una dieta que incluya muchas verduras, frutas, productos lcteos con bajo contenido de grasa y protenas magras.  No consuma muchos alimentos ricos en grasas slidas, azcares agregados o sodio. Mantenga un peso saludable El ndice de masa muscular (IMC) se utiliza para identificar problemas de peso. Proporciona una estimacin de la grasa corporal basndose en el peso y la altura. Su mdico puede ayudarle a determinar su IMC y a lograr o mantener un peso saludable. Haga ejercicio con regularidad Haga ejercicio con regularidad. Esta es una de las prcticas ms importantes que puede hacer por su salud. La mayora de los adultos deben seguir estas pautas:  Realizar, al menos, 150minutos de actividad fsica por semana. El ejercicio debe aumentar la frecuencia cardaca y hacerlo transpirar (ejercicio de intensidad moderada).  Hacer ejercicios de fortalecimiento por lo menos dos veces por semana. Agregue esto a su plan de ejercicio de intensidad moderada.  Pasar menos tiempo sentados. Incluso la actividad fsica ligera puede ser beneficiosa. Controle sus niveles de colesterol y lpidos en la sangre Comience a realizarse anlisis de lpidos y colesterol en la sangre a los 20aos y luego reptalos cada 5aos. Hgase controlar los niveles de colesterol con mayor frecuencia si:  Sus niveles de lpidos y colesterol son altos.  Es mayor de 40aos.  Presenta un alto riesgo de padecer enfermedades cardacas. Qu debo saber sobre las pruebas de  deteccin del cncer? Segn su historia clnica y sus antecedentes familiares, es posible que deba realizarse pruebas de deteccin del cncer en diferentes edades. Esto puede incluir pruebas de deteccin de lo siguiente:  Cncer de mama.  Cncer de cuello uterino.  Cncer colorrectal.  Cncer de piel.  Cncer de pulmn. Qu debo saber sobre la enfermedad cardaca, la diabetes y la hipertensin arterial? Presin arterial y enfermedad cardaca  La hipertensin arterial causa enfermedades cardacas y aumenta el riesgo de accidente cerebrovascular. Es ms probable que esto se manifieste en las personas que tienen lecturas de presin arterial alta, tienen ascendencia africana o tienen sobrepeso.  Hgase controlar la presin arterial: ? Cada 3 a 5 aos si tiene entre 18 y 39 aos. ? Todos los aos si es mayor de 40aos. Diabetes Realcese exmenes de deteccin de la diabetes con regularidad. Este anlisis revisa el nivel de azcar en la sangre en ayunas. Hgase las pruebas de deteccin:  Cada tresaos despus de los 40aos de edad si tiene un peso normal y un bajo riesgo de padecer diabetes.  Con ms frecuencia y a partir de una edad inferior si tiene sobrepeso o un alto riesgo de padecer diabetes. Qu debo saber sobre la prevencin de infecciones? Hepatitis B Si tiene un riesgo ms alto de contraer hepatitis B, debe someterse a un examen de deteccin de este virus. Hable con el mdico para averiguar si tiene riesgo de contraer la infeccin por hepatitis B. Hepatitis C Se recomienda el anlisis a:  Todos los que nacieron entre 1945 y 1965.  Todas las personas que tengan un riesgo de haber contrado hepatitis C. Enfermedades de transmisin sexual (ETS)  Hgase las   pruebas de deteccin de ITS, incluidas la gonorrea y la clamidia, si: ? Es sexualmente activa y es menor de 24aos. ? Es mayor de 24aos, y el mdico le informa que corre riesgo de tener este tipo de  infecciones. ? La actividad sexual ha cambiado desde que le hicieron la ltima prueba de deteccin y tiene un riesgo mayor de tener clamidia o gonorrea. Pregntele al mdico si usted tiene riesgo.  Pregntele al mdico si usted tiene un alto riesgo de contraer VIH. El mdico tambin puede recomendarle un medicamento recetado para ayudar a evitar la infeccin por el VIH. Si elige tomar medicamentos para prevenir el VIH, primero debe hacerse los anlisis de deteccin del VIH. Luego debe hacerse anlisis cada 3meses mientras est tomando los medicamentos. Embarazo  Si est por dejar de menstruar (fase premenopusica) y usted puede quedar embarazada, busque asesoramiento antes de quedar embarazada.  Tome de 400 a 800microgramos (mcg) de cido flico todos los das si queda embarazada.  Pida mtodos de control de la natalidad (anticonceptivos) si desea evitar un embarazo no deseado. Osteoporosis y menopausia La osteoporosis es una enfermedad en la que los huesos pierden los minerales y la fuerza por el avance de la edad. El resultado pueden ser fracturas en los huesos. Si tiene 65aos o ms, o si est en riesgo de sufrir osteoporosis y fracturas, pregunte a su mdico si debe:  Hacerse pruebas de deteccin de prdida sea.  Tomar un suplemento de calcio o de vitamina D para reducir el riesgo de fracturas.  Recibir terapia de reemplazo hormonal (TRH) para tratar los sntomas de la menopausia. Siga estas instrucciones en su casa: Estilo de vida  No consuma ningn producto que contenga nicotina o tabaco, como cigarrillos, cigarrillos electrnicos y tabaco de mascar. Si necesita ayuda para dejar de fumar, consulte al mdico.  No consuma drogas.  No comparta agujas.  Solicite ayuda a su mdico si necesita apoyo o informacin para abandonar las drogas. Consumo de alcohol  No beba alcohol si: ? Su mdico le indica no hacerlo. ? Est embarazada, puede estar embarazada o est tratando de quedar  embarazada.  Si bebe alcohol: ? Limite la cantidad que consume de 0 a 1 medida por da. ? Limite la ingesta si est amamantando.  Est atento a la cantidad de alcohol que hay en las bebidas que toma. En [place], una medida equivale a una botella de cerveza de 12oz (355ml), un vaso de vino de 5oz (148ml) o un vaso de una bebida alcohlica de alta graduacin de 1oz (44ml). Instrucciones generales  Realcese los estudios de rutina de la salud, dentales y de la vista.  Mantngase al da con las vacunas.  Infrmele a su mdico si: ? Se siente deprimida con frecuencia. ? Alguna vez ha sido vctima de maltrato o no se siente segura en su casa. Resumen  Adoptar un estilo de vida saludable y recibir atencin preventiva son importantes para promover la salud y el bienestar.  Siga las instrucciones del mdico acerca de una dieta saludable, el ejercicio y la realizacin de pruebas o exmenes para detectar enfermedades.  Siga las instrucciones del mdico con respecto al control del colesterol y la presin arterial. Esta informacin no tiene como fin reemplazar el consejo del mdico. Asegrese de hacerle al mdico cualquier pregunta que tenga. Document Revised: 04/24/2018 Document Reviewed: 04/24/2018 Elsevier Patient Education  2020 Elsevier Inc.  

## 2019-11-20 ENCOUNTER — Other Ambulatory Visit (HOSPITAL_COMMUNITY)
Admission: RE | Admit: 2019-11-20 | Discharge: 2019-11-20 | Disposition: A | Discharge status: Home / Self Care | Payer: Self-pay | Source: Ambulatory Visit | Attending: Internal Medicine | Admitting: Internal Medicine

## 2019-11-20 ENCOUNTER — Ambulatory Visit (INDEPENDENT_AMBULATORY_CARE_PROVIDER_SITE_OTHER): Payer: Self-pay | Admitting: Internal Medicine

## 2019-11-20 ENCOUNTER — Other Ambulatory Visit: Payer: Self-pay

## 2019-11-20 ENCOUNTER — Encounter: Payer: Self-pay | Admitting: Internal Medicine

## 2019-11-20 VITALS — BP 144/81 | HR 67 | Temp 97.3°F | Resp 17 | Ht 64.0 in | Wt 162.0 lb

## 2019-11-20 DIAGNOSIS — Z1231 Encounter for screening mammogram for malignant neoplasm of breast: Secondary | ICD-10-CM

## 2019-11-20 DIAGNOSIS — Z124 Encounter for screening for malignant neoplasm of cervix: Secondary | ICD-10-CM

## 2019-11-20 DIAGNOSIS — Z0000 Encounter for general adult medical examination without abnormal findings: Secondary | ICD-10-CM

## 2019-11-20 DIAGNOSIS — Z113 Encounter for screening for infections with a predominantly sexual mode of transmission: Secondary | ICD-10-CM

## 2019-11-20 DIAGNOSIS — Z1159 Encounter for screening for other viral diseases: Secondary | ICD-10-CM

## 2019-11-20 NOTE — Progress Notes (Signed)
Subjective:    Kajah Santizo - 44 y.o. female MRN [id number]  Date of birth: May 04, 1975  HPI  Kelse Ploch is here for annual exam. Visit performed with assistance of Spanish interpreter.   Patient has no concerns today. Has never had a mammogram and does not do self breast exams. No family history of breast cancer.   Has not had a PAP since her last child was born about 9 years ago. No h/o abnormal cervical cancer screening. Requests STD screening.      Health Maintenance:  Health Maintenance Due  Topic Date Due  . Hepatitis C Screening  Never done  . HIV Screening  Never done  . PAP SMEAR-Modifier  Never done  . INFLUENZA VACCINE  11/16/2019    -  reports that she has never smoked. She has never used smokeless tobacco. - Review of Systems: Per HPI. - Past Medical History: There are no problems to display for this patient.  - Medications: reviewed and updated   Objective:   Physical Exam BP (!) 144/81   Pulse 67   Temp (!) 97.3 F (36.3 C) (Temporal)   Resp 17   Ht 5\' 4"  (1.626 m)   Wt 162 lb (73.5 kg)   SpO2 98%   BMI 27.81 kg/m  Physical Exam Constitutional:      Appearance: She is not diaphoretic.  HENT:     Head: Normocephalic and atraumatic.  Eyes:     Conjunctiva/sclera: Conjunctivae normal.     Pupils: Pupils are equal, round, and reactive to light.  Neck:     Thyroid: No thyromegaly.  Cardiovascular:     Rate and Rhythm: Normal rate and regular rhythm.     Heart sounds: Normal heart sounds. No murmur heard.   Pulmonary:     Effort: Pulmonary effort is normal. No respiratory distress.     Breath sounds: Normal breath sounds. No wheezing.  Chest:     Comments: Breast exam: Breasts symmetric. No erythema/rashes/skin color changes/skin texture changes/nipple discharge. No discrete palpable masses appreciated in axilla or 4 quadrants of the breast bilaterally.  Abdominal:     General: Bowel sounds are normal. There is no distension.      Palpations: Abdomen is soft.     Tenderness: There is no abdominal tenderness. There is no guarding or rebound.  Genitourinary:    Comments: GU/GYN: Exam performed in the presence of a chaperone. External genitalia within normal limits.  Vaginal mucosa pink, moist, normal rugae.  Nonfriable cervix without lesions, no discharge or bleeding noted on speculum exam.  Bimanual exam revealed normal, nongravid uterus.  No cervical motion tenderness. No adnexal masses bilaterally.   Musculoskeletal:        General: No deformity. Normal range of motion.     Cervical back: Normal range of motion and neck supple.  Lymphadenopathy:     Cervical: No cervical adenopathy.  Skin:    General: Skin is warm and dry.     Findings: No rash.  Neurological:     Mental Status: She is alert and oriented to person, place, and time.     Gait: Gait is intact.  Psychiatric:        Mood and Affect: Mood and affect normal.        Judgment: Judgment normal.            Assessment & Plan:   1. Annual physical exam Counseled on 150 minutes of exercise per week, healthy eating (including decreased daily intake of  saturated fats, cholesterol, added sugars, sodium), STI prevention, routine healthcare maintenance. - CBC with Differential - Comprehensive metabolic panel - Lipid Panel  2. Pap smear for cervical cancer screening - Cytology - PAP([facility])  3. Breast cancer screening by mammogram - [place] DIGITAL SCREENING BILATERAL; Future  4. Screening for STDs (sexually transmitted diseases) - Cervicovaginal ancillary only - HIV antibody (with reflex) - RPR  5. Need for hepatitis C screening test - HCV Ab w/Rflx to Verification   Marcy Siren, D.O. 11/20/2019, 8:45 AM Primary Care at [facility]

## 2019-11-21 LAB — CERVICOVAGINAL ANCILLARY ONLY
Bacterial Vaginitis (gardnerella): NEGATIVE
Candida Glabrata: NEGATIVE
Candida Vaginitis: NEGATIVE
Chlamydia: NEGATIVE
Comment: NEGATIVE
Comment: NEGATIVE
Comment: NEGATIVE
Comment: NEGATIVE
Comment: NEGATIVE
Comment: NORMAL
Neisseria Gonorrhea: NEGATIVE
Trichomonas: NEGATIVE

## 2019-11-21 LAB — CBC WITH DIFFERENTIAL/PLATELET
Basophils Absolute: 0.1 10*3/uL (ref 0.0–0.2)
Basos: 1 %
EOS (ABSOLUTE): 0.3 10*3/uL (ref 0.0–0.4)
Eos: 4 %
Hematocrit: 42.4 % (ref 34.0–46.6)
Hemoglobin: 14.2 g/dL (ref 11.1–15.9)
Immature Grans (Abs): 0 10*3/uL (ref 0.0–0.1)
Immature Granulocytes: 0 %
Lymphocytes Absolute: 2.6 10*3/uL (ref 0.7–3.1)
Lymphs: 40 %
MCH: 28.7 pg (ref 26.6–33.0)
MCHC: 33.5 g/dL (ref 31.5–35.7)
MCV: 86 fL (ref 79–97)
Monocytes Absolute: 0.4 10*3/uL (ref 0.1–0.9)
Monocytes: 6 %
Neutrophils Absolute: 3.1 10*3/uL (ref 1.4–7.0)
Neutrophils: 49 %
Platelets: 337 10*3/uL (ref 150–450)
RBC: 4.94 x10E6/uL (ref 3.77–5.28)
RDW: 13 % (ref 11.7–15.4)
WBC: 6.4 10*3/uL (ref 3.4–10.8)

## 2019-11-21 LAB — COMPREHENSIVE METABOLIC PANEL
ALT: 50 IU/L — ABNORMAL HIGH (ref 0–32)
AST: 38 IU/L (ref 0–40)
Albumin/Globulin Ratio: 1.5 (ref 1.2–2.2)
Albumin: 4.5 g/dL (ref 3.8–4.8)
Alkaline Phosphatase: 130 IU/L — ABNORMAL HIGH (ref 48–121)
BUN/Creatinine Ratio: 22 (ref 9–23)
BUN: 16 mg/dL (ref 6–24)
Bilirubin Total: 0.4 mg/dL (ref 0.0–1.2)
CO2: 23 mmol/L (ref 20–29)
Calcium: 9.5 mg/dL (ref 8.7–10.2)
Chloride: 104 mmol/L (ref 96–106)
Creatinine, Ser: 0.72 mg/dL (ref 0.57–1.00)
GFR calc Af Amer: 116 mL/min/{1.73_m2} (ref >59)
GFR calc non Af Amer: 101 mL/min/{1.73_m2} (ref >59)
Globulin, Total: 3.1 g/dL (ref 1.5–4.5)
Glucose: 215 mg/dL — ABNORMAL HIGH (ref 65–99)
Potassium: 4.2 mmol/L (ref 3.5–5.2)
Sodium: 140 mmol/L (ref 134–144)
Total Protein: 7.6 g/dL (ref 6.0–8.5)

## 2019-11-21 LAB — RPR, QUANT+TP ABS (REFLEX)
Rapid Plasma Reagin, Quant: 1:1 {titer} — ABNORMAL HIGH
T Pallidum Abs: NONREACTIVE

## 2019-11-21 LAB — LIPID PANEL
Chol/HDL Ratio: 4.9 ratio — ABNORMAL HIGH (ref 0.0–4.4)
Cholesterol, Total: 204 mg/dL — ABNORMAL HIGH (ref 100–199)
HDL: 42 mg/dL (ref >39)
LDL Chol Calc (NIH): 138 mg/dL — ABNORMAL HIGH (ref 0–99)
Triglycerides: 135 mg/dL (ref 0–149)
VLDL Cholesterol Cal: 24 mg/dL (ref 5–40)

## 2019-11-21 LAB — HCV AB W/RFLX TO VERIFICATION: HCV Ab: <0.1 s/co ratio (ref 0.0–0.9)

## 2019-11-21 LAB — RPR: RPR Ser Ql: REACTIVE — AB

## 2019-11-21 LAB — HCV INTERPRETATION

## 2019-11-21 LAB — HIV ANTIBODY (ROUTINE TESTING W REFLEX): HIV Screen 4th Generation wRfx: NONREACTIVE

## 2019-11-24 LAB — CYTOLOGY - PAP
Comment: NEGATIVE
Diagnosis: NEGATIVE
High risk HPV: NEGATIVE

## 2019-12-30 ENCOUNTER — Telehealth: Payer: Self-pay | Admitting: Internal Medicine

## 2019-12-30 NOTE — Telephone Encounter (Signed)
Copied from CRM [id number]. Topic: General - Call Back - No Documentation >> Dec 29, 2019  4:49 PM Randol Kern wrote: Reason for CRM: Pt needs a call back from office regarding a letter she received. Needs an interpreter.   Best contact: [phone]

## 2021-01-26 ENCOUNTER — Other Ambulatory Visit: Payer: Self-pay

## 2021-01-26 DIAGNOSIS — Z1231 Encounter for screening mammogram for malignant neoplasm of breast: Secondary | ICD-10-CM

## 2021-01-26 NOTE — Progress Notes (Signed)
31

## 2021-02-01 ENCOUNTER — Other Ambulatory Visit: Payer: Self-pay | Admitting: Obstetrics and Gynecology

## 2021-02-01 DIAGNOSIS — Z1231 Encounter for screening mammogram for malignant neoplasm of breast: Secondary | ICD-10-CM

## 2021-03-31 ENCOUNTER — Ambulatory Visit: Payer: Self-pay

## 2021-03-31 ENCOUNTER — Inpatient Hospital Stay: Admission: RE | Admit: 2021-03-31 | Payer: Self-pay | Source: Ambulatory Visit

## 2022-02-13 ENCOUNTER — Emergency Department (HOSPITAL_COMMUNITY)
Admission: EM | Admit: 2022-02-13 | Discharge: 2022-02-13 | Disposition: A | Discharge status: Home / Self Care | Payer: Self-pay | Attending: Emergency Medicine | Admitting: Emergency Medicine

## 2022-02-13 ENCOUNTER — Other Ambulatory Visit: Payer: Self-pay

## 2022-02-13 ENCOUNTER — Encounter (HOSPITAL_COMMUNITY): Payer: Self-pay

## 2022-02-13 ENCOUNTER — Emergency Department (HOSPITAL_COMMUNITY): Payer: Self-pay

## 2022-02-13 DIAGNOSIS — R0789 Other chest pain: Secondary | ICD-10-CM | POA: Insufficient documentation

## 2022-02-13 LAB — BASIC METABOLIC PANEL
Anion gap: 12 (ref 5–15)
BUN: 13 mg/dL (ref 6–20)
CO2: 20 mmol/L — ABNORMAL LOW (ref 22–32)
Calcium: 9.6 mg/dL (ref 8.9–10.3)
Chloride: 107 mmol/L (ref 98–111)
Creatinine, Ser: 0.64 mg/dL (ref 0.44–1.00)
GFR, Estimated: >60 mL/min (ref >60)
Glucose, Bld: 166 mg/dL — ABNORMAL HIGH (ref 70–99)
Potassium: 3.7 mmol/L (ref 3.5–5.1)
Sodium: 139 mmol/L (ref 135–145)

## 2022-02-13 LAB — CBC
HCT: 40.4 % (ref 36.0–46.0)
Hemoglobin: 14.2 g/dL (ref 12.0–15.0)
MCH: 29.4 pg (ref 26.0–34.0)
MCHC: 35.1 g/dL (ref 30.0–36.0)
MCV: 83.6 fL (ref 80.0–100.0)
Platelets: 325 10*3/uL (ref 150–400)
RBC: 4.83 MIL/uL (ref 3.87–5.11)
RDW: 11.8 % (ref 11.5–15.5)
WBC: 8.4 10*3/uL (ref 4.0–10.5)
nRBC: 0 % (ref 0.0–0.2)

## 2022-02-13 LAB — TROPONIN I (HIGH SENSITIVITY)
Troponin I (High Sensitivity): 6 ng/L (ref <18)
Troponin I (High Sensitivity): 3 ng/L (ref <18)

## 2022-02-13 LAB — I-STAT BETA HCG BLOOD, ED (MC, WL, AP ONLY): I-stat hCG, quantitative: <5 m[IU]/mL (ref <5)

## 2022-02-13 NOTE — ED Provider Triage Note (Signed)
Emergency Medicine Provider Triage Evaluation Note  [name] , a 46 y.o. female  was evaluated in triage.  Patient presenting with chest pain.  Reports that it started earlier today and seem to be worse with exertion and inspiration.  No history of DVT, recent travel, leg swelling, tobacco or estrogen.  Currently asymptomatic.  Went to fire department and they said her blood pressure was slightly elevated, 962E systolic and they sent her here  Review of Systems  Positive: None currently Negative: Previous right-sided rib pain and some dyspnea  Physical Exam  BP 137/77 (BP Location: Right Arm)   Pulse (!) 57   Temp 98.1 F (36.7 C)   Resp 15   Ht 5\' 4"  (1.626 m)   Wt 73.5 kg   SpO2 100%   BMI 27.81 kg/m  Gen:   Awake, no distress   Resp:  Normal effort  MSK:   Moves extremities without difficulty  Other:  No reproducible chest wall tenderness.  RRR.  Nontender abdomen  Medical Decision Making  Medically screening exam initiated at 1:01 PM.  Appropriate orders placed.  Jordan Ortiz-Torres was informed that the remainder of the evaluation will be completed by another provider, this initial triage assessment does not replace that evaluation, and the importance of remaining in the ED until their evaluation is complete.     Rhae Hammock, PA-C 02/13/22 1304

## 2022-02-13 NOTE — ED Provider Notes (Cosign Needed Addendum)
[facility] Provider Note   CSN: [id number] Arrival date & time: 02/13/22  1052     History  Chief Complaint  Patient presents with   Chest Pain    Tailynn Armetta is a 46 y.o. female.  The history is provided by the patient and the spouse. No language interpreter was used.  Chest Pain    Ms. Ortiz-Torres is a 32y female with no significant medical history here for chest pain that occurred 16 hours ago and resolved after 2-3 minutes. She is not currently having pain. She is here with her husband who is translating from [place]. She woke up this morning to use the bathroom around 6 am, returned to bed, and had a sudden onset of 3-4/10 left lower chest pain and left arm pain that resolved spontaneously after 2-3 minutes. During this episode, she had shortness of breath which resolved with the pain. She has had similar pain once before several weeks ago that occurred while cleaning that presented in the same manner. She denies any trauma, injuries, nausea, vomiting, abdominal pain, or recent medication changes. She does admit to a mild cough 2-3 days ago that has resolved- she thinks it is seasonal allergies. She does not have a personal or family history of cardiovascular disorders. There is no history of clots, leg pain or swelling, or recent surgery.  Home Medications Prior to Admission medications   Not on File      Allergies    Patient has no known allergies.    Review of Systems   Review of Systems  Cardiovascular:  Positive for chest pain.  All other systems reviewed and are negative.   Physical Exam Updated Vital Signs BP (!) 148/74 (BP Location: Right Arm)   Pulse (!) 52   Temp 98.3 F (36.8 C) (Oral)   Resp 16   Ht 5\' 4"  (1.626 m)   Wt 73.5 kg   SpO2 100%   BMI 27.81 kg/m  Physical Exam Vitals and nursing note reviewed.  Constitutional:      General: She is not in acute distress.    Appearance: She is  well-developed.  HENT:     Head: Atraumatic.  Eyes:     Conjunctiva/sclera: Conjunctivae normal.  Pulmonary:     Effort: Pulmonary effort is normal.  Musculoskeletal:     Cervical back: Neck supple.  Skin:    Findings: No rash.  Neurological:     Mental Status: She is alert.  Psychiatric:        Mood and Affect: Mood normal.     ED Results / Procedures / Treatments   Labs (all labs ordered are listed, but only abnormal results are displayed) Labs Reviewed  BASIC METABOLIC PANEL - Abnormal; Notable for the following components:      Result Value   CO2 20 (*)    Glucose, Bld 166 (*)    All other components within normal limits  CBC  I-STAT BETA HCG BLOOD, ED ([facility], [facility], [facility] ONLY)  TROPONIN I (HIGH SENSITIVITY)  TROPONIN I (HIGH SENSITIVITY)    EKG None ED ECG REPORT   Date: 02/13/2022  Rate: 57  Rhythm: sinus bradycardia  QRS Axis: normal  Intervals: normal  ST/T Wave abnormalities: normal  Conduction Disutrbances:none  Narrative Interpretation:   Old EKG Reviewed: none available  I have personally reviewed the EKG tracing and agree with the computerized printout as noted.   Radiology DG Chest 2 View  Result Date: 02/13/2022 CLINICAL DATA:  Chest pain EXAM: CHEST - 2 VIEW COMPARISON:  None Available. FINDINGS: The heart size and mediastinal contours are within normal limits. Both lungs are clear. The visualized skeletal structures are unremarkable. IMPRESSION: No active cardiopulmonary disease. Electronically Signed   By: Ernie Avena M.D.   On: 02/13/2022 13:34    Procedures Procedures    Medications Ordered in ED Medications - No data to display  ED Course/ Medical Decision Making/ A&P                           Medical Decision Making  BP (!) 148/74 (BP Location: Right Arm)   Pulse (!) 52   Temp 98.3 F (36.8 C) (Oral)   Resp 16   Ht 5\' 4"  (1.626 m)   Wt 73.5 kg   SpO2 100%   BMI 27.81 kg/m   8:55 PM 6:30 am L sided CP with L arm pain  and SOB lasting 2-3 min and resolved.  One similar episode several weeks prior.  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    46 year old Hispanic female accompanied by husband to ER for evaluation of chest pain.  History was obtained through husband who acts as a language interpreter per patient request.  Around 6:30 AM patient developed some left-sided chest pain which radiates to her left arm.  She described pain as an achy sharp sensation lasting for approximately 5 minutes and resolved without any specific treatment.  She did recall having 1 similar episode several weeks prior while she was cleaning house.  She was not doing any exertional work today.  Since the incident her pain has complete resolved.  She denies any associate fever chills no runny nose sneezing coughing no shortness of breath no abdominal pain or back pain no arm weakness no rash.  She denies alcohol or tobacco use.  She does not take medication on regular basis.  No history of GERD.  She has had chickenpox in the past.  On exam this is a well-appearing female resting in bed comfortably appears to be in no acute discomfort.  Heart lung sounds normal, abdomen soft nontender, chest exam without any reproducible chest pain.  Radial pulse 2+ bilaterally.  No overlying skin changes to the affected area.  Pain seems to be more mid lateral chest wall along the bra strap.  Labs and imaging obtained independently viewed interpreted by me and are reassuring.  At this time patient is stable to go home.  Her hear score is 2, low risk of Mace, she is PERC negative, doubt PE.  Symptoms not consistent with dissection.  She does not have any epigastric tenderness suggest GERD or gastritis.  She does not have any skin rash on the skin to suggest shingles.  She was given strict return precaution.  -Labs ordered, independently viewed and interpreted by me.  Labs remarkable for reassuring labs -EKG interpreted by me showing sinus  bradycardia -imaging independently viewed and interpreted by me and I agree with radiologist's interpretation.  Result remarkable for reassuring CXR -DDx: ACS, PE, gastritis, costochondritis, GERD, anxiety, MSK, shingles -treatment includes none -PCP office notes or outside notes reviewed -Discussion with pt's husband -Escalation to admission/observation considered: patients feels much better, is comfortable with discharge, and will follow up with PCP -Prescription medication considered, patient comfortable with ibuprofen -Social Determinant of Health considered which includes none         Final Clinical Impression(s) / ED Diagnoses Final diagnoses:  Atypical  chest pain    Rx / DC Orders ED Discharge Orders     None         Fayrene Helper, PA-C 02/13/22 2137    Fayrene Helper, PA-C 02/13/22 2153    Glyn Ade, MD 02/16/22 [phone]

## 2022-02-13 NOTE — ED Triage Notes (Signed)
Patient woke up this morning with sharp pain under left breast that went into left arm.  Denies pain in back.  Reports it took a few minutes to go away and is now gone.  Reports went to fire station and told BP was high and told to come here.  Denies n/v but reports it is painful to take a deep breath.
